# Patient Record
Sex: Male | Born: 1960 | Race: Black or African American | Hispanic: No | Marital: Single | Smoking: Former smoker
Health system: Southern US, Community
[De-identification: ages and names within clinical notes are randomized; demographics above are authoritative.]

## PROBLEM LIST (undated history)

## (undated) DIAGNOSIS — M545 Low back pain, unspecified: Secondary | ICD-10-CM

## (undated) DIAGNOSIS — H548 Legal blindness, as defined in USA: Secondary | ICD-10-CM

## (undated) DIAGNOSIS — E785 Hyperlipidemia, unspecified: Secondary | ICD-10-CM

## (undated) DIAGNOSIS — I10 Essential (primary) hypertension: Secondary | ICD-10-CM

## (undated) HISTORY — PX: EYE SURGERY: SHX253

---

## 2015-02-04 ENCOUNTER — Ambulatory Visit
Admission: RE | Admit: 2015-02-04 | Discharge: 2015-02-04 | Disposition: A | Discharge status: Home / Self Care | Payer: No Typology Code available for payment source | Source: Ambulatory Visit | Attending: Family Medicine | Admitting: Family Medicine

## 2015-02-04 ENCOUNTER — Other Ambulatory Visit: Payer: Self-pay | Admitting: Family Medicine

## 2015-02-04 DIAGNOSIS — J40 Bronchitis, not specified as acute or chronic: Secondary | ICD-10-CM

## 2015-03-11 ENCOUNTER — Encounter: Payer: Self-pay | Admitting: Internal Medicine

## 2015-03-11 ENCOUNTER — Ambulatory Visit (INDEPENDENT_AMBULATORY_CARE_PROVIDER_SITE_OTHER): Payer: Medicare HMO | Admitting: Internal Medicine

## 2015-03-11 VITALS — BP 140/80 | HR 65 | Ht 68.0 in | Wt 177.0 lb

## 2015-03-11 DIAGNOSIS — R0789 Other chest pain: Secondary | ICD-10-CM | POA: Diagnosis not present

## 2015-03-11 NOTE — Patient Instructions (Signed)
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 Frequently these patients have had multiple negative GI workups and CT scans.  Treatment consists of avoiding foods that cause gas (especially boiled eggs/ [place] food in all forms,  beans and raw vegetables like spinach and salads)  and citrucel 1 heaping tsp twice daily with a large glass of water.  Pain should improve w/in 2 weeks and if not then consider further evaluation here.

## 2015-03-11 NOTE — Progress Notes (Signed)
Subjective:     Patient ID: Benjamin Morales, male   DOB: [birth year],   MRN: [id number]  HPI  54 yobm legally blind from [place] quit smoking 10/2014 with recurrent LUQ/ cp onset age 54's multiple trips to [facility]/ [facility] with h/o brake lining exposure referred 03/11/2015  by Lin Landsman with worse cp x 2014 never eval by pulmonary previously although "they planned to send me to a [facility] pulmonary doctor next.    03/11/2015 1st [facility] office visit/ Wert ([facility] doctor previously)   Chief Complaint  Patient presents with  . Pulmonary Consult    Referred by Dr. Rosezella Florida. Pt c/o pain in left side of his chest- comes and goes and is worse with inspiration.  He states that this has been bothering him since he was 54 y/o.  He also c/o occ SOB and non prod cough.   pain is worse with deep breath mostly ant sometimes rad to post lateral  Can be relieved for sev days to weeks then come back exact same distribution, positional in that when lies flat it moves more toward the shoulder and if rolls over in bed sometimes will jolt him in a different location on the L "like a pressures/ compression sensation" No assoc h/o rash or paresthesia/ no abd complaints. Not worse with ex more bothersome day sitting that walking or noct supine.  Typically worse pain builds up over a matter of minutes then some better. Denies constipation or bloating. Likes [place] food   No obvious day to day or daytime variability or assoc chronic cough or cp  or overt sinus or hb symptoms. No unusual exp hx or h/o childhood pna/ asthma or knowledge of premature birth.  Sleeping ok without nocturnal  or early am exacerbation  of respiratory  c/o's or need for noct saba. Also denies any obvious fluctuation of symptoms with weather or environmental changes or other aggravating or alleviating factors except as outlined above   Current Medications, Allergies, Complete Past Medical History, Past Surgical  History, Family History, and Social History were reviewed in [employer] record.  ROS  The following are not active complaints unless bolded sore throat, dysphagia, dental problems, itching, sneezing,  nasal congestion or excess/ purulent secretions, ear ache,   fever, chills, sweats, unintended wt loss,  exertional cp, hemoptysis,  orthopnea pnd or leg swelling, presyncope, palpitations, abdominal pain, anorexia, nausea, vomiting, diarrhea  or change in bowel or bladder habits, change in stools or urine, dysuria,hematuria,  rash, arthralgias, visual complaints, headache, numbness, weakness or ataxia or problems with walking or coordination,  change in mood/affect or memory.           Review of Systems     Objective:   Physical Exam   amb black male nad   Wt Readings from Last 3 Encounters:  03/11/15 177 lb (80.287 kg)    Vital signs reviewed   HEENT: nl dentition, turbinates, and orophanx. Nl external ear canals without cough reflex   NECK :  without JVD/Nodes/TM/ nl carotid upstrokes bilaterally   LUNGS: no acc muscle use, clear to A and P bilaterally without cough on insp or exp maneuvers   CV:  RRR  no s3 or murmur or increase in P2, no edema   ABD:  soft and nontender with nl excursion in the supine position. No bruits or organomegaly, bowel sounds nl  MS:  warm without deformities, calf tenderness, cyanosis or clubbing  SKIN: warm and  dry without lesions    NEURO:  alert, approp, no deficits     I personally reviewed images and agree with radiology impression as follows:  CXR:   02/04/15 No active cardiopulmonary disease.  Labs from 01/30/15 reviewed  Nl tsh/ cmet/cbc  No esr       Assessment:

## 2015-03-12 ENCOUNTER — Encounter: Payer: Self-pay | Admitting: Internal Medicine

## 2015-03-12 ENCOUNTER — Ambulatory Visit (INDEPENDENT_AMBULATORY_CARE_PROVIDER_SITE_OTHER)
Admission: RE | Admit: 2015-03-12 | Discharge: 2015-03-12 | Disposition: A | Discharge status: Home / Self Care | Payer: Medicare HMO | Source: Ambulatory Visit | Attending: Internal Medicine | Admitting: Internal Medicine

## 2015-03-12 DIAGNOSIS — R0789 Other chest pain: Secondary | ICD-10-CM | POA: Diagnosis not present

## 2015-03-12 MED ORDER — IOHEXOL 350 MG/ML SOLN
80.0000 mL | Freq: Once | INTRAVENOUS | Status: AC | PRN
  Administered 2015-03-12: 80 mL via INTRAVENOUS

## 2015-03-12 NOTE — Assessment & Plan Note (Signed)
He seems most concerned about remote asbestos exposure but this would be an extremely unlikely presentation for mesothelioma and pleural plaques dont' cause this pattern pain.  Most likely this is just IBS with transmitted pain up above the dome of the L HD  I had an extended discussion with the patient reviewing all relevant studies completed to date x 22m  rec Reasonable to do CT with contrast as apparently never done(or at least not to his recollection, though this is hard to believe in [place], [place] medical [place])  IBS diet/ citrucel regularly   F/u in 2 weeks prn   Each maintenance medication was reviewed in detail including most importantly the difference between maintenance and as needed and under what circumstances the prns are to be used.  Please see instructions for details which were reviewed in writing and the patient given a copy.

## 2015-03-13 ENCOUNTER — Other Ambulatory Visit: Payer: Self-pay | Admitting: Internal Medicine

## 2015-03-13 DIAGNOSIS — R0789 Other chest pain: Secondary | ICD-10-CM

## 2015-03-13 DIAGNOSIS — N2889 Other specified disorders of kidney and ureter: Secondary | ICD-10-CM | POA: Insufficient documentation

## 2015-03-13 NOTE — Progress Notes (Signed)
Quick Note:  Order was sent to [facility] ______ 

## 2015-03-19 ENCOUNTER — Ambulatory Visit (INDEPENDENT_AMBULATORY_CARE_PROVIDER_SITE_OTHER)
Admission: RE | Admit: 2015-03-19 | Discharge: 2015-03-19 | Disposition: A | Discharge status: Home / Self Care | Payer: Medicare HMO | Source: Ambulatory Visit | Attending: Internal Medicine | Admitting: Internal Medicine

## 2015-03-19 ENCOUNTER — Other Ambulatory Visit: Payer: Self-pay | Admitting: Internal Medicine

## 2015-03-19 DIAGNOSIS — N2889 Other specified disorders of kidney and ureter: Secondary | ICD-10-CM

## 2015-03-19 MED ORDER — IOHEXOL 350 MG/ML SOLN
100.0000 mL | Freq: Once | INTRAVENOUS | Status: AC | PRN
  Administered 2015-03-19: 100 mL via INTRAVENOUS

## 2015-03-19 NOTE — Progress Notes (Signed)
Quick Note:  Spoke with pt and notified of results per Dr. Wert. Pt verbalized understanding and denied any questions.  ______ 

## 2015-03-27 ENCOUNTER — Other Ambulatory Visit (HOSPITAL_COMMUNITY): Payer: Self-pay | Admitting: Urology

## 2015-03-27 DIAGNOSIS — N2889 Other specified disorders of kidney and ureter: Secondary | ICD-10-CM

## 2015-04-10 ENCOUNTER — Other Ambulatory Visit (HOSPITAL_COMMUNITY): Payer: Self-pay | Admitting: Urology

## 2015-04-10 ENCOUNTER — Ambulatory Visit (HOSPITAL_COMMUNITY)
Admission: RE | Admit: 2015-04-10 | Discharge: 2015-04-10 | Disposition: A | Discharge status: Home / Self Care | Payer: Medicare HMO | Source: Ambulatory Visit | Attending: Urology | Admitting: Urology

## 2015-04-10 DIAGNOSIS — N2889 Other specified disorders of kidney and ureter: Secondary | ICD-10-CM

## 2015-04-10 DIAGNOSIS — Z9889 Other specified postprocedural states: Secondary | ICD-10-CM

## 2015-04-10 DIAGNOSIS — N281 Cyst of kidney, acquired: Secondary | ICD-10-CM | POA: Diagnosis not present

## 2015-04-10 DIAGNOSIS — K760 Fatty (change of) liver, not elsewhere classified: Secondary | ICD-10-CM | POA: Diagnosis not present

## 2015-04-10 DIAGNOSIS — N289 Disorder of kidney and ureter, unspecified: Secondary | ICD-10-CM | POA: Diagnosis present

## 2015-04-10 LAB — POCT I-STAT CREATININE: Creatinine, Ser: 1.1 mg/dL (ref 0.61–1.24)

## 2015-04-10 MED ORDER — GADOBENATE DIMEGLUMINE 529 MG/ML IV SOLN
15.0000 mL | Freq: Once | INTRAVENOUS | Status: AC | PRN
  Administered 2015-04-10: 15 mL via INTRAVENOUS

## 2015-05-13 ENCOUNTER — Telehealth: Payer: Self-pay | Admitting: Internal Medicine

## 2015-05-13 NOTE — Telephone Encounter (Signed)
No f/u needed except by urology as planned

## 2015-05-13 NOTE — Telephone Encounter (Signed)
Per Chantel, PCP did not need a call back. She sent message to triage to see if pt does need a follow up. No f/u mentioned from [facility] in July. Please advise MW if pt still needs to f/u thanks

## 2015-08-21 ENCOUNTER — Emergency Department (HOSPITAL_COMMUNITY): Payer: Medicare HMO

## 2015-08-21 ENCOUNTER — Encounter (HOSPITAL_COMMUNITY): Payer: Self-pay

## 2015-08-21 ENCOUNTER — Observation Stay (HOSPITAL_COMMUNITY): Payer: Medicare HMO

## 2015-08-21 ENCOUNTER — Observation Stay (HOSPITAL_COMMUNITY)
Admission: EM | Admit: 2015-08-21 | Discharge: 2015-08-22 | Disposition: A | Discharge status: Home / Self Care | Payer: Medicare HMO | Attending: Internal Medicine | Admitting: Internal Medicine

## 2015-08-21 DIAGNOSIS — G8929 Other chronic pain: Secondary | ICD-10-CM | POA: Diagnosis not present

## 2015-08-21 DIAGNOSIS — E785 Hyperlipidemia, unspecified: Secondary | ICD-10-CM | POA: Diagnosis not present

## 2015-08-21 DIAGNOSIS — R0789 Other chest pain: Secondary | ICD-10-CM | POA: Diagnosis not present

## 2015-08-21 DIAGNOSIS — G4700 Insomnia, unspecified: Secondary | ICD-10-CM | POA: Diagnosis not present

## 2015-08-21 DIAGNOSIS — R42 Dizziness and giddiness: Secondary | ICD-10-CM | POA: Diagnosis not present

## 2015-08-21 DIAGNOSIS — Z7982 Long term (current) use of aspirin: Secondary | ICD-10-CM | POA: Insufficient documentation

## 2015-08-21 DIAGNOSIS — J9811 Atelectasis: Secondary | ICD-10-CM | POA: Insufficient documentation

## 2015-08-21 DIAGNOSIS — I10 Essential (primary) hypertension: Secondary | ICD-10-CM

## 2015-08-21 DIAGNOSIS — R079 Chest pain, unspecified: Secondary | ICD-10-CM | POA: Diagnosis present

## 2015-08-21 DIAGNOSIS — Z888 Allergy status to other drugs, medicaments and biological substances status: Secondary | ICD-10-CM | POA: Diagnosis not present

## 2015-08-21 DIAGNOSIS — K210 Gastro-esophageal reflux disease with esophagitis: Secondary | ICD-10-CM | POA: Diagnosis not present

## 2015-08-21 DIAGNOSIS — H540 Blindness, both eyes: Secondary | ICD-10-CM | POA: Insufficient documentation

## 2015-08-21 DIAGNOSIS — K219 Gastro-esophageal reflux disease without esophagitis: Secondary | ICD-10-CM | POA: Diagnosis not present

## 2015-08-21 DIAGNOSIS — Z87891 Personal history of nicotine dependence: Secondary | ICD-10-CM | POA: Insufficient documentation

## 2015-08-21 DIAGNOSIS — R0602 Shortness of breath: Secondary | ICD-10-CM | POA: Diagnosis not present

## 2015-08-21 DIAGNOSIS — R05 Cough: Secondary | ICD-10-CM | POA: Insufficient documentation

## 2015-08-21 HISTORY — DX: Legal blindness, as defined in USA: H54.8

## 2015-08-21 HISTORY — DX: Essential (primary) hypertension: I10

## 2015-08-21 HISTORY — DX: Low back pain: M54.5

## 2015-08-21 HISTORY — DX: Low back pain, unspecified: M54.50

## 2015-08-21 HISTORY — DX: Hyperlipidemia, unspecified: E78.5

## 2015-08-21 LAB — BASIC METABOLIC PANEL
Anion gap: 11 (ref 5–15)
BUN: 14 mg/dL (ref 6–20)
CALCIUM: 9.4 mg/dL (ref 8.9–10.3)
CO2: 26 mmol/L (ref 22–32)
Chloride: 102 mmol/L (ref 101–111)
Creatinine, Ser: 1.3 mg/dL — ABNORMAL HIGH (ref 0.61–1.24)
GFR calc Af Amer: >60 mL/min (ref >60)
GFR, EST NON AFRICAN AMERICAN: 60 mL/min — AB (ref >60)
GLUCOSE: 145 mg/dL — AB (ref 65–99)
Potassium: 3.8 mmol/L (ref 3.5–5.1)
Sodium: 139 mmol/L (ref 135–145)

## 2015-08-21 LAB — CBC
HEMATOCRIT: 35.3 % — AB (ref 39.0–52.0)
Hemoglobin: 11.4 g/dL — ABNORMAL LOW (ref 13.0–17.0)
MCH: 27.2 pg (ref 26.0–34.0)
MCHC: 32.3 g/dL (ref 30.0–36.0)
MCV: 84.2 fL (ref 78.0–100.0)
Platelets: 221 10*3/uL (ref 150–400)
RBC: 4.19 MIL/uL — ABNORMAL LOW (ref 4.22–5.81)
RDW: 13.6 % (ref 11.5–15.5)
WBC: 7.3 10*3/uL (ref 4.0–10.5)

## 2015-08-21 LAB — TROPONIN I

## 2015-08-21 LAB — RAPID URINE DRUG SCREEN, HOSP PERFORMED
AMPHETAMINES: NOT DETECTED
BARBITURATES: NOT DETECTED
BENZODIAZEPINES: POSITIVE — AB
Cocaine: NOT DETECTED
Opiates: POSITIVE — AB
Tetrahydrocannabinol: NOT DETECTED

## 2015-08-21 LAB — I-STAT TROPONIN, ED
TROPONIN I, POC: 0 ng/mL (ref 0.00–0.08)
Troponin i, poc: 0 ng/mL (ref 0.00–0.08)

## 2015-08-21 LAB — D-DIMER, QUANTITATIVE: D-Dimer, Quant: 0.63 ug/mL-FEU — ABNORMAL HIGH (ref 0.00–0.50)

## 2015-08-21 MED ORDER — GABAPENTIN 300 MG PO CAPS
300.0000 mg | ORAL_CAPSULE | Freq: Three times a day (TID) | ORAL | Status: DC
  Administered 2015-08-22: 300 mg via ORAL
  Filled 2015-08-21 (×2): qty 1

## 2015-08-21 MED ORDER — ALPRAZOLAM 0.5 MG PO TABS
2.0000 mg | ORAL_TABLET | Freq: Every evening | ORAL | Status: DC
  Filled 2015-08-21: qty 4

## 2015-08-21 MED ORDER — AMLODIPINE BESYLATE 10 MG PO TABS
10.0000 mg | ORAL_TABLET | Freq: Every day | ORAL | Status: DC
Start: 2015-08-22 — End: 2015-08-22
  Administered 2015-08-22: 10 mg via ORAL
  Filled 2015-08-21: qty 1

## 2015-08-21 MED ORDER — IOHEXOL 350 MG/ML SOLN
100.0000 mL | Freq: Once | INTRAVENOUS | Status: AC | PRN
  Administered 2015-08-21: 100 mL via INTRAVENOUS

## 2015-08-21 MED ORDER — ASPIRIN 81 MG PO CHEW: 324.0000 mg | CHEWABLE_TABLET | Freq: Once | ORAL | Status: DC

## 2015-08-21 MED ORDER — ALPRAZOLAM 0.5 MG PO TABS: 2.0000 mg | ORAL_TABLET | Freq: Every evening | ORAL | Status: DC

## 2015-08-21 MED ORDER — PANTOPRAZOLE SODIUM 40 MG PO TBEC
40.0000 mg | DELAYED_RELEASE_TABLET | Freq: Every day | ORAL | Status: DC
  Administered 2015-08-22: 40 mg via ORAL

## 2015-08-21 MED ORDER — ACETAMINOPHEN 325 MG PO TABS
650.0000 mg | ORAL_TABLET | ORAL | Status: DC | PRN
  Administered 2015-08-22: 650 mg via ORAL
  Filled 2015-08-21: qty 2

## 2015-08-21 MED ORDER — PANTOPRAZOLE SODIUM 40 MG PO TBEC
40.0000 mg | DELAYED_RELEASE_TABLET | Freq: Every day | ORAL | Status: DC
  Filled 2015-08-21: qty 1

## 2015-08-21 MED ORDER — ATORVASTATIN CALCIUM 40 MG PO TABS
40.0000 mg | ORAL_TABLET | Freq: Every day | ORAL | Status: DC
  Administered 2015-08-21 – 2015-08-22 (×2): 40 mg via ORAL
  Filled 2015-08-21 (×2): qty 1

## 2015-08-21 MED ORDER — MORPHINE SULFATE (PF) 4 MG/ML IV SOLN
8.0000 mg | Freq: Once | INTRAVENOUS | Status: DC
  Filled 2015-08-21: qty 2

## 2015-08-21 MED ORDER — LOSARTAN POTASSIUM 50 MG PO TABS
50.0000 mg | ORAL_TABLET | Freq: Every day | ORAL | Status: DC
  Administered 2015-08-22: 50 mg via ORAL
  Filled 2015-08-21: qty 1

## 2015-08-21 MED ORDER — SODIUM CHLORIDE 0.9 % IV BOLUS (SEPSIS): 1000.0000 mL | Freq: Once | INTRAVENOUS | Status: DC

## 2015-08-21 MED ORDER — MORPHINE SULFATE (PF) 2 MG/ML IV SOLN: 1.0000 mg | INTRAVENOUS | Status: DC | PRN

## 2015-08-21 MED ORDER — OXYCODONE-ACETAMINOPHEN 5-325 MG PO TABS
1.0000 | ORAL_TABLET | Freq: Four times a day (QID) | ORAL | Status: DC | PRN
  Administered 2015-08-21 – 2015-08-22 (×2): 1 via ORAL
  Filled 2015-08-21 (×2): qty 1

## 2015-08-21 MED ORDER — OXYCODONE-ACETAMINOPHEN 10-325 MG PO TABS: 1.0000 | ORAL_TABLET | Freq: Four times a day (QID) | ORAL | Status: DC | PRN

## 2015-08-21 MED ORDER — HYDRALAZINE HCL 20 MG/ML IJ SOLN: 10.0000 mg | Freq: Four times a day (QID) | INTRAMUSCULAR | Status: DC | PRN

## 2015-08-21 MED ORDER — NITROGLYCERIN 0.4 MG SL SUBL
0.4000 mg | SUBLINGUAL_TABLET | SUBLINGUAL | Status: DC | PRN
  Administered 2015-08-21 (×2): 0.4 mg via SUBLINGUAL
  Filled 2015-08-21: qty 1

## 2015-08-21 MED ORDER — SODIUM CHLORIDE 0.9 % IV BOLUS (SEPSIS)
500.0000 mL | Freq: Once | INTRAVENOUS | Status: AC
  Administered 2015-08-21: 500 mL via INTRAVENOUS

## 2015-08-21 MED ORDER — GI COCKTAIL ~~LOC~~
30.0000 mL | Freq: Four times a day (QID) | ORAL | Status: DC | PRN
  Administered 2015-08-22: 30 mL via ORAL
  Filled 2015-08-21: qty 30

## 2015-08-21 MED ORDER — MORPHINE SULFATE (PF) 4 MG/ML IV SOLN
4.0000 mg | Freq: Once | INTRAVENOUS | Status: AC
  Administered 2015-08-21: 4 mg via INTRAVENOUS

## 2015-08-21 MED ORDER — ENOXAPARIN SODIUM 40 MG/0.4ML ~~LOC~~ SOLN
40.0000 mg | SUBCUTANEOUS | Status: DC
  Administered 2015-08-21: 40 mg via SUBCUTANEOUS
  Filled 2015-08-21: qty 0.4

## 2015-08-21 MED ORDER — NITROGLYCERIN 0.3 MG SL SUBL
0.3000 mg | SUBLINGUAL_TABLET | SUBLINGUAL | Status: DC | PRN
  Filled 2015-08-21: qty 100

## 2015-08-21 MED ORDER — OXYCODONE HCL 5 MG PO TABS
5.0000 mg | ORAL_TABLET | Freq: Four times a day (QID) | ORAL | Status: DC | PRN
Start: 2015-08-21 — End: 2015-08-22
  Administered 2015-08-21 – 2015-08-22 (×2): 5 mg via ORAL
  Filled 2015-08-21 (×2): qty 1

## 2015-08-21 MED ORDER — MORPHINE SULFATE (PF) 4 MG/ML IV SOLN
8.0000 mg | Freq: Once | INTRAVENOUS | Status: AC
  Administered 2015-08-21: 8 mg via INTRAVENOUS
  Filled 2015-08-21: qty 2

## 2015-08-21 MED ORDER — CARISOPRODOL 350 MG PO TABS
350.0000 mg | ORAL_TABLET | Freq: Three times a day (TID) | ORAL | Status: DC
Start: 2015-08-21 — End: 2015-08-22
  Administered 2015-08-21 – 2015-08-22 (×2): 350 mg via ORAL
  Filled 2015-08-21 (×2): qty 1

## 2015-08-21 MED ORDER — ONDANSETRON HCL 4 MG/2ML IJ SOLN: 4.0000 mg | Freq: Four times a day (QID) | INTRAMUSCULAR | Status: DC | PRN

## 2015-08-21 MED ORDER — ALPRAZOLAM 0.5 MG PO TABS
2.0000 mg | ORAL_TABLET | Freq: Every day | ORAL | Status: DC
  Administered 2015-08-21: 2 mg via ORAL
  Filled 2015-08-21: qty 4

## 2015-08-21 NOTE — ED Notes (Signed)
MD states pt cannot goto floor without ct.

## 2015-08-21 NOTE — ED Notes (Signed)
Pt returns from ct  

## 2015-08-21 NOTE — ED Notes (Signed)
Pt transported to CT ?

## 2015-08-21 NOTE — ED Notes (Signed)
Pt arrives EMS with c/o gradual onset chest pain. Previous episode of same with ellevated bp.. Pt states feels like heaviness

## 2015-08-21 NOTE — ED Provider Notes (Signed)
CSN: [id number]     Arrival date & time 08/21/15  1238 History   First MD Initiated Contact with Patient 08/21/15 1303     Chief Complaint  Patient presents with  . Chest Pain     (Consider location/radiation/quality/duration/timing/severity/associated sxs/prior Treatment) HPI Comments: Patient presents with chest pain. He has a history of hypertension and hyperlipidemia. He states he's had chronic chest pain several months. He states it's gotten a little worse over the last month. Today he had a sharp pain in his left chest. Radiating under his left arm. This is the same type of pain these had in the past. He has some associated shortness of breath. He describes it as a sharp heavy feeling. It's nonexertional. It's not pleuritic. It is worse with movement. He denies any leg pain or swelling. He states he had a cardiac catheterization at [facility] about 2-3 years ago. He reports that this was normal. He states that he normally takes Percocet for his chest pain as well as back pain. He denies any cough fevers or chest congestion.  Patient is a 54 y.o. male presenting with chest pain.  Chest Pain Associated symptoms: shortness of breath   Associated symptoms: no abdominal pain, no back pain, no cough, no diaphoresis, no dizziness, no fatigue, no fever, no headache, no nausea, no numbness, not vomiting and no weakness     Past Medical History  Diagnosis Date  . Hypertension   . Low back pain    Past Surgical History  Procedure Laterality Date  . Eye surgery     Family History  Problem Relation Age of Onset  . Emphysema Paternal Uncle     smoked  . Heart disease Father    Social History  Substance Use Topics  . Smoking status: Former Smoker -- 1.00 packs/day for 30 years    Types: Cigarettes    Quit date: 10/29/2014  . Smokeless tobacco: Never Used  . Alcohol Use: 0.0 oz/week    0 Standard drinks or equivalent per week     Comment: occasional    Review of  Systems  Constitutional: Negative for fever, chills, diaphoresis and fatigue.  HENT: Negative for congestion, rhinorrhea and sneezing.   Eyes: Negative.   Respiratory: Positive for shortness of breath. Negative for cough and chest tightness.   Cardiovascular: Positive for chest pain. Negative for leg swelling.  Gastrointestinal: Negative for nausea, vomiting, abdominal pain, diarrhea and blood in stool.  Genitourinary: Negative for frequency, hematuria, flank pain and difficulty urinating.  Musculoskeletal: Negative for back pain and arthralgias.  Skin: Negative for rash.  Neurological: Negative for dizziness, speech difficulty, weakness, numbness and headaches.      Allergies  Thorazine  Home Medications   Prior to Admission medications   Medication Sig Start Date End Date Taking? Authorizing Provider  alprazolam Duanne Moron) 2 MG tablet Take 2 mg by mouth every evening. 08/19/15  Yes Historical Provider, MD  amLODipine (NORVASC) 10 MG tablet Take 10 mg by mouth daily.  02/02/15  Yes Historical Provider, MD  atorvastatin (LIPITOR) 40 MG tablet Take 40 mg by mouth daily. 07/26/15  Yes Historical Provider, MD  carisoprodol (SOMA) 350 MG tablet Take 350 mg by mouth 3 (three) times daily. 08/19/15  Yes Historical Provider, MD  gabapentin (NEURONTIN) 300 MG capsule Take 300 mg by mouth 3 (three) times daily.   Yes Historical Provider, MD  losartan (COZAAR) 50 MG tablet Take 1 tablet by mouth daily. 02/02/15  Yes Historical Provider, MD  omeprazole (PRILOSEC) 20 MG capsule Take 1 capsule by mouth daily. 01/26/15  Yes Historical Provider, MD  oxyCODONE-acetaminophen (PERCOCET) 10-325 MG per tablet Take 1-2 tablets by mouth every 6 (six) hours as needed. 02/25/15  Yes Historical Provider, MD   BP 135/93 mmHg  Pulse 97  Temp(Src) 98.3 F (36.8 C)  Resp 12  Ht 5\' 8"  (1.727 m)  Wt 176 lb (79.833 kg)  BMI 26.77 kg/m2  SpO2 97% Physical Exam  Constitutional: He is oriented to person, place, and  time. He appears well-developed and well-nourished.  HENT:  Head: Normocephalic and atraumatic.  Eyes: Pupils are equal, round, and reactive to light.  Neck: Normal range of motion. Neck supple.  Cardiovascular: Normal rate, regular rhythm and normal heart sounds.   Pulmonary/Chest: Effort normal and breath sounds normal. No respiratory distress. He has no wheezes. He has no rales. He exhibits no tenderness.  Abdominal: Soft. Bowel sounds are normal. There is no tenderness. There is no rebound and no guarding.  Musculoskeletal: Normal range of motion. He exhibits no edema.  No edema or calf tenderness  Lymphadenopathy:    He has no cervical adenopathy.  Neurological: He is alert and oriented to person, place, and time.  Skin: Skin is warm and dry. No rash noted.  Psychiatric: He has a normal mood and affect.    ED Course  Procedures (including critical care time) Labs Review Results for orders placed or performed during the hospital encounter of [id number]  Basic metabolic panel  Result Value Ref Range   Sodium 139 135 - 145 mmol/L   Potassium 3.8 3.5 - 5.1 mmol/L   Chloride 102 101 - 111 mmol/L   CO2 26 22 - 32 mmol/L   Glucose, Bld 145 (H) 65 - 99 mg/dL   BUN 14 6 - 20 mg/dL   Creatinine, Ser 1.30 (H) 0.61 - 1.24 mg/dL   Calcium 9.4 8.9 - 10.3 mg/dL   GFR calc non Af Amer 60 (L) >60 mL/min   GFR calc Af Amer >60 >60 mL/min   Anion gap 11 5 - 15  CBC  Result Value Ref Range   WBC 7.3 4.0 - 10.5 K/uL   RBC 4.19 (L) 4.22 - 5.81 MIL/uL   Hemoglobin 11.4 (L) 13.0 - 17.0 g/dL   HCT 35.3 (L) 39.0 - 52.0 %   MCV 84.2 78.0 - 100.0 fL   MCH 27.2 26.0 - 34.0 pg   MCHC 32.3 30.0 - 36.0 g/dL   RDW 13.6 11.5 - 15.5 %   Platelets 221 150 - 400 K/uL  D-dimer, quantitative  Result Value Ref Range   D-Dimer, Quant 0.63 (H) 0.00 - 0.50 ug/mL-FEU  I-stat troponin, ED (not at [facility], [facility])  Result Value Ref Range   Troponin i, poc 0.00 0.00 - 0.08 ng/mL   Comment 3           Dg Chest 2  View  08/21/2015  CLINICAL DATA:  Chronic left-sided chest pain which worsened today. EXAM: CHEST  2 VIEW COMPARISON:  CT chest 03/12/2015.  PA and lateral chest 02/04/2015. FINDINGS: The lungs are clear. Heart size is upper normal. No pneumothorax or pleural effusion. No focal bony abnormality. IMPRESSION: No acute disease. Electronically Signed   By: Inge Rise M.D.   On: 08/21/2015 14:19      Imaging Review Dg Chest 2 View  08/21/2015  CLINICAL DATA:  Chronic left-sided chest pain which worsened today. EXAM: CHEST  2 VIEW COMPARISON:  CT chest  03/12/2015.  PA and lateral chest 02/04/2015. FINDINGS: The lungs are clear. Heart size is upper normal. No pneumothorax or pleural effusion. No focal bony abnormality. IMPRESSION: No acute disease. Electronically Signed   By: Inge Rise M.D.   On: 08/21/2015 14:19   I have personally reviewed and evaluated these images and lab results as part of my medical decision-making.   EKG Interpretation   Date/Time:  Thursday August 21 2015 15:06:10 EST Ventricular Rate:  89 PR Interval:  163 QRS Duration: 85 QT Interval:  351 QTC Calculation: 427 R Axis:   8 Text Interpretation:  Sinus rhythm Probable left atrial enlargement  Borderline T abnormalities, inferior leads Borderline ST elevation,  anterior leads Confirmed by Tempie Gibeault  MD, Diontay Rosencrans ([id number]) on 08/21/2015  3:13:21 PM      MDM   Final diagnoses:  Chest pain, unspecified chest pain type    Patient presents with chest pain and shortness of breath. He had some associated dizziness as well. There is no nausea or diaphoresis. He has some what appears to be J-point elevation in V2 and V3. There is no old EKG for comparison. His troponin is negative. He does have ongoing chest pain after morphine. We'll also give aspirin and nitro. I suspect this may be musculoskeletal in nature. However he has several risk factors. He has hypertension hyperlipidemia and a family history of early  heart disease. This gives him a heart score of 5. Given this I will admit him for further cardiac evaluation. His PCP is [facility] which is unassigned.  I spoke with Nevin Bloodgood with [facility] who will admit pt.  I did check a d-dimer given some ongoing tachycardia and a slight pleuritic component to the pain. This was mildly elevated. A CT chest is pending.    Malvin Johns, MD 08/21/15 [phone]

## 2015-08-21 NOTE — H&P (Signed)
[facility] History and Physical  Benjamin Morales [id number] DOB: 03/30/1961 DOA: 08/21/2015  Referring physician: Emergency Department PCP: Kristine Garbe, MD   CHIEF COMPLAINT:   Chest pain      HPI: Benjamin Morales is a 54 y.o. male presenting to the emergency room with chest pain. Patient was evaluated in July of this year by Pulmonary who felt pain was GI in nature.  Chest CTA was unrevealing. Subsequently, CT scan of the abdomen and pelvis was also unrevealing. There were some renal lesions felt to be cyst. Follow-up MRI in August revealed probable fatty liver and mildly complex renal cyst in the left upper kidney for which six-month follow-up MRI was recommended.  Chest pain almost constant for 3 weeks, it is worse with coughing, deep breaths. Exertion exacerbates pain but it occurs with rest as well. Pain radiates upward along right side of neck up right side of face. He endorses occasional cough, non-productive. He correlates the pain with elevated BP which he checks at home. Pain better when BP normal.    ED COURSE:    Labs:   Troponin 0.0. BUN 14, creatinine 1.3. Glucose 145 Normal white count. Hemoglobin 11.4,  MCV 84.         CXR:    No acute abnormalities         EKG:    Sinus rhythm Probable left atrial enlargement Borderline T abnormalities, inferior leads Borderline ST elevation, anterior leads                  Medications  aspirin chewable tablet 324 mg (324 mg Oral Not Given 08/21/15 1531)  nitroGLYCERIN (NITROSTAT) SL tablet 0.4 mg (0.4 mg Sublingual Given 08/21/15 1537)  morphine 4 MG/ML injection 8 mg (8 mg Intravenous Given 08/21/15 1340)  sodium chloride 0.9 % bolus 500 mL (500 mLs Intravenous New Bag/Given 08/21/15 1528)  morphine 4 MG/ML injection 4 mg (4 mg Intravenous Given 08/21/15 1512)   Review of Systems  Constitutional: Negative.   HENT: Negative.   Eyes: Negative.   Respiratory: Positive for shortness of breath.   Cardiovascular:  Positive for chest pain.  Genitourinary: Negative.   Musculoskeletal: Negative.   Skin: Negative.   Neurological: Negative.   Endo/Heme/Allergies: Negative.   Psychiatric/Behavioral: Negative.     Past Medical History  Diagnosis Date  . Hypertension   . Low back pain    Past Surgical History  Procedure Laterality Date  . Eye surgery      SOCIAL HISTORY:  reports that he quit smoking about 9 months ago. His smoking use included Cigarettes. He has a 30 pack-year smoking history. He has never used smokeless tobacco. He reports that he drinks alcohol. He reports that he does not use illicit drugs. Lives: at home alone   Assistive devices:   cane needed for ambulation.   Allergies  Allergen Reactions  . Thorazine [Chlorpromazine] Anaphylaxis    Family History  Problem Relation Age of Onset  . Emphysema Paternal Uncle     smoked  . Heart disease Father      Prior to Admission medications   Medication Sig Start Date End Date Taking? Authorizing Provider  alprazolam Duanne Moron) 2 MG tablet Take 2 mg by mouth every evening. 08/19/15  Yes Historical Provider, MD  amLODipine (NORVASC) 10 MG tablet Take 10 mg by mouth daily.  02/02/15  Yes Historical Provider, MD  atorvastatin (LIPITOR) 40 MG tablet Take 40 mg by mouth daily. 07/26/15  Yes Historical Provider, MD  carisoprodol (  SOMA) 350 MG tablet Take 350 mg by mouth 3 (three) times daily. 08/19/15  Yes Historical Provider, MD  gabapentin (NEURONTIN) 300 MG capsule Take 300 mg by mouth 3 (three) times daily.   Yes Historical Provider, MD  losartan (COZAAR) 50 MG tablet Take 1 tablet by mouth daily. 02/02/15  Yes Historical Provider, MD  omeprazole (PRILOSEC) 20 MG capsule Take 1 capsule by mouth daily. 01/26/15  Yes Historical Provider, MD  oxyCODONE-acetaminophen (PERCOCET) 10-325 MG per tablet Take 1-2 tablets by mouth every 6 (six) hours as needed. 02/25/15  Yes Historical Provider, MD   PHYSICAL EXAM: Filed Vitals:   08/21/15 1330  08/21/15 1422 08/21/15 1510 08/21/15 1535  BP: 120/76 135/93 138/91 113/82  Pulse: 89 97 81 88  Temp:      Resp: 12  10 16   Height:      Weight:      SpO2: 98% 97% 97% 99%    Wt Readings from Last 3 Encounters:  08/21/15 79.833 kg (176 lb)  04/10/15 74.844 kg (165 lb)  03/11/15 80.287 kg (177 lb)    General:  Pleasant blacki male. Appears calm and comfortable Eyes: PER, normal lids, irises & conjunctiva ENT: grossly normal hearing, lips & tongue Neck: no LAD, no masses Cardiovascular: RRR, no murmurs. Trace BLE edema.  Respiratory: Respirations even and unlabored. Normal respiratory effort. Lungs CTA bilaterally, no wheezes / rales .   Abdomen: soft, non-distended, non-tender, active bowel sounds. No obvious masses.  Skin: no rash seen on limited exam Musculoskeletal: grossly normal tone BUE/BLE. Mild left sided chest wall tenderness Psychiatric: grossly normal mood and affect, speech fluent and appropriate Neurologic: grossly non-focal.         LABS ON ADMISSION:    Basic Metabolic Panel:  Recent Labs Lab 08/21/15 1308  NA 139  K 3.8  CL 102  CO2 26  GLUCOSE 145*  BUN 14  CREATININE 1.30*  CALCIUM 9.4   CBC:  Recent Labs Lab 08/21/15 1308  WBC 7.3  HGB 11.4*  HCT 35.3*  MCV 84.2  PLT 221   CREATININE: 1.3 mg/dL ABNORMAL (08/21/15 1308) Estimated creatinine clearance - 61.4 mL/min  Radiological Exams on Admission: Dg Chest 2 View  08/21/2015  CLINICAL DATA:  Chronic left-sided chest pain which worsened today. EXAM: CHEST  2 VIEW COMPARISON:  CT chest 03/12/2015.  PA and lateral chest 02/04/2015. FINDINGS: The lungs are clear. Heart size is upper normal. No pneumothorax or pleural effusion. No focal bony abnormality. IMPRESSION: No acute disease. Electronically Signed   By: Inge Rise M.D.   On: 08/21/2015 14:19    ASSESSMENT / PLAN   Chest pain. Chest pain score 4. Pain nearly constant, worse with deep breaths, cough but also exacerbated by  physical activity. EKG shows borderline ST elevation in anterior leads.  First Troponion normal. CXR unrevealing. Pain may be cardiac in nature versus musculoskeletal. Doubt pulmonary source,  patient was evaluated by Pulmonary in July of this year. His d-dimer is mildly elevated. CT chest pending.  -Admit to observation-telemetry bed -Cardiology will evaluate in the a.m  -Cycle troponins -Repeat EKG in the a.m .   Insomnia.  Takes Xanax at bedtime -continue home HS Xanax  Hypertension. Takes Norvasc and Cozaar. DBP 80s to 96 in ED today.  -continue home antihypertensives -add prn hydralazine  GERD. Takes daily PPI -continue daily PPI  Blindness. Very limited vision  CONSULTANTS:   Cardiology - Spoke to Dr. Mare Ferrari. Cardiology will evaluate in am. .  Code Status: Full code DVT Prophylaxis: Lovenox Family Communication:  Patient alert, oriented and understands plan of care.  Disposition Plan: Discharge to home in 24-48 hours   Time spent: 60 minutes Tye Savoy  NP [facility] Pager [phone]

## 2015-08-21 NOTE — ED Notes (Signed)
Made CT aware that pt is ready for them to come get him.

## 2015-08-21 NOTE — ED Notes (Signed)
Hospitalist came into room, and delayed CT.

## 2015-08-21 NOTE — ED Notes (Signed)
Pt states pain free after 2 nitro sl.

## 2015-08-22 ENCOUNTER — Other Ambulatory Visit: Payer: Self-pay | Admitting: Student

## 2015-08-22 ENCOUNTER — Observation Stay (HOSPITAL_BASED_OUTPATIENT_CLINIC_OR_DEPARTMENT_OTHER): Payer: Medicare HMO

## 2015-08-22 ENCOUNTER — Encounter (HOSPITAL_COMMUNITY): Payer: Self-pay | Admitting: Student

## 2015-08-22 DIAGNOSIS — G4700 Insomnia, unspecified: Secondary | ICD-10-CM | POA: Diagnosis not present

## 2015-08-22 DIAGNOSIS — E785 Hyperlipidemia, unspecified: Secondary | ICD-10-CM

## 2015-08-22 DIAGNOSIS — I10 Essential (primary) hypertension: Secondary | ICD-10-CM

## 2015-08-22 DIAGNOSIS — K219 Gastro-esophageal reflux disease without esophagitis: Secondary | ICD-10-CM | POA: Diagnosis not present

## 2015-08-22 DIAGNOSIS — R071 Chest pain on breathing: Secondary | ICD-10-CM

## 2015-08-22 DIAGNOSIS — R079 Chest pain, unspecified: Secondary | ICD-10-CM

## 2015-08-22 DIAGNOSIS — R0789 Other chest pain: Secondary | ICD-10-CM | POA: Diagnosis not present

## 2015-08-22 LAB — TROPONIN I

## 2015-08-22 MED ORDER — ASPIRIN EC 81 MG PO TBEC: 81.0000 mg | DELAYED_RELEASE_TABLET | Freq: Every day | ORAL | Status: AC

## 2015-08-22 NOTE — Progress Notes (Signed)
D/C instructions discussed with pt; all questions answered and pt verbalized understanding.

## 2015-08-22 NOTE — Progress Notes (Signed)
[facility] PROGRESS NOTE  Benjamin Morales [id number] DOB: 06/25/1961 DOA: 08/21/2015 PCP: Kristine Garbe, MD  Assessment/Plan: 1. Chest pain. -Benjamin Morales presenting with chest pain having atypical features. He ascribes sharp stabbing pain located in the left thoracic wall worsened with deep inspiration. -Workup thus far has been unrevealing, EKG did not reveal acute ischemic changes. Thus far he has had 2 negative troponins. CT scan of lungs with IV contrast did not show evidence for pulmonary embolism. CT scan did not reveal pulmonary edema or pneumonia as well.  -Suspect symptoms may be related to pleurisy. -Cardiology consultation placed  2.   Hypertension. -Continue amlodipine 10 mg by mouth daily and losartan 50 mg by mouth daily  Code Status: Full code Family Communication: Family not present Disposition Plan: Patient in overnight observation, anticipate discharge within the next 24 hours.   Consultants: Cardiology   HPI/Subjective: Benjamin Morales is a pleasant 54 year old gentleman with a past medical history of hypertension who was admitted to the medicine service on 08/21/2015 when he presented with chest pain having atypical features. EKG did not reveal acute ischemic changes. He was monitored overnight on continuous cardiac monitoring with cycling of troponins that remained negative. He described his chest pain is sharp stabbing located in the left thoracic wall worsened by deep inspiration. Symptoms appearing consistent with pleurisy. Chest x-ray did not show acute disease.  Objective: Filed Vitals:   08/22/15 0535 08/22/15 0838  BP: 124/84 139/84  Pulse: 64 61  Temp: 98.6 F (37 C) 98.4 F (36.9 C)  Resp: 20 18    Intake/Output Summary (Last 24 hours) at 08/22/15 1017 Last data filed at 08/22/15 0945  Gross per 24 hour  Intake   1240 ml  Output   2675 ml  Net  -1435 ml   Filed Weights   08/21/15 1253 08/21/15 1828 08/22/15 0535  Weight: 79.833 kg (176  lb) 83.144 kg (183 lb 4.8 oz) 81.33 kg (179 lb 4.8 oz)    Exam:   General:  No acute distress, he is awake alert oriented. Asking for breakfast  Cardiovascular: Regular rate and rhythm normal S1-S2 no murmurs rubs or gallops  Respiratory: Normal respiratory effort, lungs are clear to auscultation bilaterally  Abdomen: Soft nontender nontender positive bowel sounds  Musculoskeletal he reported pain to palpation along the left chest wall, no hematoma or evidence of trauma over chest wall.  Data Reviewed: Basic Metabolic Panel:  Recent Labs Lab 08/21/15 1308  NA 139  K 3.8  CL 102  CO2 26  GLUCOSE 145*  BUN 14  CREATININE 1.30*  CALCIUM 9.4   Liver Function Tests: No results for input(s): AST, ALT, ALKPHOS, BILITOT, PROT, ALBUMIN in the last 168 hours. No results for input(s): LIPASE, AMYLASE in the last 168 hours. No results for input(s): AMMONIA in the last 168 hours. CBC:  Recent Labs Lab 08/21/15 1308  WBC 7.3  HGB 11.4*  HCT 35.3*  MCV 84.2  PLT 221   Cardiac Enzymes:  Recent Labs Lab 08/21/15 1859 08/21/15 2116 08/22/15 0024  TROPONINI <0.03 <0.03 <0.03   BNP (last 3 results) No results for input(s): BNP in the last 8760 hours.  ProBNP (last 3 results) No results for input(s): PROBNP in the last 8760 hours.  CBG: No results for input(s): GLUCAP in the last 168 hours.  No results found for this or any previous visit (from the past 240 hour(s)).   Studies: Dg Chest 2 View  08/21/2015  CLINICAL DATA:  Chronic left-sided  chest pain which worsened today. EXAM: CHEST  2 VIEW COMPARISON:  CT chest 03/12/2015.  PA and lateral chest 02/04/2015. FINDINGS: The lungs are clear. Heart size is upper normal. No pneumothorax or pleural effusion. No focal bony abnormality. IMPRESSION: No acute disease. Electronically Signed   By: Inge Rise M.D.   On: 08/21/2015 14:19   Ct Angio Chest Pe W/cm &/or Wo Cm  08/21/2015  CLINICAL DATA:  Left-sided chest  pain for 2-3 weeks. Dizziness and shortness of breath. EXAM: CT ANGIOGRAPHY CHEST WITH CONTRAST TECHNIQUE: Multidetector CT imaging of the chest was performed using the standard protocol during bolus administration of intravenous contrast. Multiplanar CT image reconstructions and MIPs were obtained to evaluate the vascular anatomy. CONTRAST:  160mL OMNIPAQUE IOHEXOL 350 MG/ML SOLN COMPARISON:  Chest radiograph, 08/21/2015. FINDINGS: Angiographic study: No evidence of a pulmonary embolus. Pulmonary arteries are normal in caliber. Thoracic aorta is normal in caliber. No dissection. No significant atherosclerosis. Neck base and axilla:  No mass or adenopathy.  Normal thyroid. Mediastinum and hila: Heart mildly enlarged. No significant coronary artery calcifications. No mediastinal or hilar masses or pathologically enlarged lymph nodes. Lungs and pleura: There is dependent reticular and linear opacity most evident in the lower lobes consistent with atelectasis. No convincing pneumonia. No pulmonary edema. 5 mm nodule lies in the right upper lobe peripherally, image 60, series 407, similar to the prior chest CT. 3 mm nodule is seen in the posterior, inferior and peripheral left upper lobe lingula, not visualized on the prior CT. Other tiny nodules are similar to the prior CT. No pleural effusion. No pneumothorax. Limited upper abdomen: Small probable cyst from the superior margin of the right kidney. 8 mm low-density lesion in the right lobe of the liver. There are several other small low-density liver lesions, all likely cysts. These were present on the prior CT. Musculoskeletal:  Unremarkable. Review of the MIP images confirms the above findings. IMPRESSION: 1. No evidence of pulmonary embolism. 2. No acute findings. 3. There is dependent atelectasis mostly in the lower lobes. No pulmonary edema or convincing pneumonia. 4. Small pulmonary nodules similar to the prior CT. Recommend CT follow-up in 6 months per  Fleischner criteria as described on the prior CT. Electronically Signed   By: Lajean Manes M.D.   On: 08/21/2015 18:10    Scheduled Meds: . ALPRAZolam  2 mg Oral QHS  . amLODipine  10 mg Oral Daily  . aspirin  324 mg Oral Once  . atorvastatin  40 mg Oral Daily  . carisoprodol  350 mg Oral TID  . enoxaparin (LOVENOX) injection  40 mg Subcutaneous Q24H  . gabapentin  300 mg Oral TID  . losartan  50 mg Oral Daily  . pantoprazole  40 mg Oral Daily  . pantoprazole  40 mg Oral Q0600   Continuous Infusions:   Active Problems:   Other chest pain   Chest pain   Insomnia   Hypertension   GERD (gastroesophageal reflux disease)    Time spent:     Kelvin Cellar  [facility] Pager [phone]. If 7PM-7AM, please contact night-coverage at [email or web address], password [facility] 08/22/2015, 10:17 AM

## 2015-08-22 NOTE — Discharge Summary (Signed)
Physician Discharge Summary  Benjamin Morales [id number] DOB: Apr 10, 1961 DOA: 08/21/2015  PCP: Kristine Garbe, MD  Admit date: 08/21/2015 Discharge date: 08/22/2015  Time spent: 35 minutes  Recommendations for Outpatient Follow-up:  1. Patient admitted for chest pain rule out, will be set up with outpatient stress test   Discharge Diagnoses:  Active Problems:   Other chest pain   Chest pain   Insomnia   Hypertension   GERD (gastroesophageal reflux disease)   Discharge Condition: Stable  Diet recommendation: Heart healthy  Filed Weights   08/21/15 1253 08/21/15 1828 08/22/15 0535  Weight: 79.833 kg (176 lb) 83.144 kg (183 lb 4.8 oz) 81.33 kg (179 lb 4.8 oz)    History of present illness:  Benjamin Morales is a 54 y.o. male presenting to the emergency room with chest pain. Patient was evaluated in July of this year by Pulmonary who felt pain was GI in nature. Chest CTA was unrevealing. Subsequently, CT scan of the abdomen and pelvis was also unrevealing. There were some renal lesions felt to be cyst. Follow-up MRI in August revealed probable fatty liver and mildly complex renal cyst in the left upper kidney for which six-month follow-up MRI was recommended.  Chest pain almost constant for 3 weeks, it is worse with coughing, deep breaths. Exertion exacerbates pain but it occurs with rest as well. Pain radiates upward along right side of neck up right side of face. He endorses occasional cough, non-productive. He correlates the pain with elevated BP which he checks at home. Pain better when BP normal.   Hospital Course:  Mr. Benjamin Morales is a pleasant 54 year old gentleman with a past medical history of hypertension who was admitted to the medicine service on 08/21/2015 when he presented with chest pain having atypical features. EKG did not reveal acute ischemic changes. He was monitored overnight on continuous cardiac monitoring with cycling of troponins that remained negative. He described  his chest pain is sharp stabbing located in the left thoracic wall worsened by deep inspiration. Symptoms appearing consistent with pleurisy. Chest x-ray did not show acute disease. Transthoracic echocardiogram did not reveal wall motion abnormalities. Cardiology did not recommend further cardiac workup in the inpatient setting. He'll be set up with an outpatient stress test. He was discharged home in stable condition on 08/22/2015  Procedures:  Transthoracic echocardiogram performed on 08/22/2015 impression: - Left ventricle: The cavity size was normal. Systolic function was normal. The estimated ejection fraction was in the range of 55% to 60%. Wall motion was normal; there were no regional wall motion abnormalities. Left ventricular diastolic function parameters were normal.  Consultations:  Cardiology  Discharge Exam: Filed Vitals:   08/22/15 0535 08/22/15 0838  BP: 124/84 139/84  Pulse: 64 61  Temp: 98.6 F (37 C) 98.4 F (36.9 C)  Resp: 20 18      Discharge Instructions  General: No acute distress, he is awake alert oriented. Asking for breakfast  Cardiovascular: Regular rate and rhythm normal S1-S2 no murmurs rubs or gallops  Respiratory: Normal respiratory effort, lungs are clear to auscultation bilaterally  Abdomen: Soft nontender nontender positive bowel sounds  Musculoskeletal he reported pain to palpation along the left chest wall, no hematoma or evidence of trauma over chest wall.  Discharge Instructions    Call MD for:  difficulty breathing, headache or visual disturbances    Complete by:  As directed      Call MD for:  extreme fatigue    Complete by:  As directed  Call MD for:  hives    Complete by:  As directed      Call MD for:  persistant dizziness or light-headedness    Complete by:  As directed      Call MD for:  persistant nausea and vomiting    Complete by:  As directed      Call MD for:  redness, tenderness, or signs of infection  (pain, swelling, redness, odor or green/yellow discharge around incision site)    Complete by:  As directed      Call MD for:  severe uncontrolled pain    Complete by:  As directed      Call MD for:  temperature >100.4    Complete by:  As directed      Call MD for:    Complete by:  As directed      Diet - low sodium heart healthy    Complete by:  As directed      Increase activity slowly    Complete by:  As directed           Current Discharge Medication List    START taking these medications   Details  aspirin EC 81 MG tablet Take 1 tablet (81 mg total) by mouth daily. Qty: 30 tablet, Refills: 0      CONTINUE these medications which have NOT CHANGED   Details  alprazolam (XANAX) 2 MG tablet Take 2 mg by mouth every evening. Refills: 0    amLODipine (NORVASC) 10 MG tablet Take 10 mg by mouth daily.     atorvastatin (LIPITOR) 40 MG tablet Take 40 mg by mouth daily. Refills: 1    carisoprodol (SOMA) 350 MG tablet Take 350 mg by mouth 3 (three) times daily. Refills: 0    gabapentin (NEURONTIN) 300 MG capsule Take 300 mg by mouth 3 (three) times daily.    losartan (COZAAR) 50 MG tablet Take 1 tablet by mouth daily.    omeprazole (PRILOSEC) 20 MG capsule Take 1 capsule by mouth daily.    oxyCODONE-acetaminophen (PERCOCET) 10-325 MG per tablet Take 1-2 tablets by mouth every 6 (six) hours as needed. Refills: 0       Allergies  Allergen Reactions  . Thorazine [Chlorpromazine] Anaphylaxis   Follow-up Information    Follow up with Peter Martinique, MD.   Specialty:  Cardiology   Why:  The office will call you within the next week to arrange an exercise stress test. If you do not hear from them, please call the number above.   Contact information:   [address] [place] [address] [phone]       Follow up with REESE,BETTI D, MD In 2 weeks.   Specialty:  Family Medicine   Contact information:   [id number]  [address] [phone]        The results of significant diagnostics from this hospitalization (including imaging, microbiology, ancillary and laboratory) are listed below for reference.    Significant Diagnostic Studies: Dg Chest 2 View  08/21/2015  CLINICAL DATA:  Chronic left-sided chest pain which worsened today. EXAM: CHEST  2 VIEW COMPARISON:  CT chest 03/12/2015.  PA and lateral chest 02/04/2015. FINDINGS: The lungs are clear. Heart size is upper normal. No pneumothorax or pleural effusion. No focal bony abnormality. IMPRESSION: No acute disease. Electronically Signed   By: Inge Rise M.D.   On: 08/21/2015 14:19   Ct Angio Chest Pe W/cm &/or Wo Cm  08/21/2015  CLINICAL DATA:  Left-sided chest pain for 2-3 weeks. Dizziness and shortness of breath. EXAM: CT ANGIOGRAPHY CHEST WITH CONTRAST TECHNIQUE: Multidetector CT imaging of the chest was performed using the standard protocol during bolus administration of intravenous contrast. Multiplanar CT image reconstructions and MIPs were obtained to evaluate the vascular anatomy. CONTRAST:  166mL OMNIPAQUE IOHEXOL 350 MG/ML SOLN COMPARISON:  Chest radiograph, 08/21/2015. FINDINGS: Angiographic study: No evidence of a pulmonary embolus. Pulmonary arteries are normal in caliber. Thoracic aorta is normal in caliber. No dissection. No significant atherosclerosis. Neck base and axilla:  No mass or adenopathy.  Normal thyroid. Mediastinum and hila: Heart mildly enlarged. No significant coronary artery calcifications. No mediastinal or hilar masses or pathologically enlarged lymph nodes. Lungs and pleura: There is dependent reticular and linear opacity most evident in the lower lobes consistent with atelectasis. No convincing pneumonia. No pulmonary edema. 5 mm nodule lies in the right upper lobe peripherally, image 60, series 407, similar to the prior chest CT. 3 mm nodule is seen in the posterior, inferior and peripheral left upper lobe lingula, not  visualized on the prior CT. Other tiny nodules are similar to the prior CT. No pleural effusion. No pneumothorax. Limited upper abdomen: Small probable cyst from the superior margin of the right kidney. 8 mm low-density lesion in the right lobe of the liver. There are several other small low-density liver lesions, all likely cysts. These were present on the prior CT. Musculoskeletal:  Unremarkable. Review of the MIP images confirms the above findings. IMPRESSION: 1. No evidence of pulmonary embolism. 2. No acute findings. 3. There is dependent atelectasis mostly in the lower lobes. No pulmonary edema or convincing pneumonia. 4. Small pulmonary nodules similar to the prior CT. Recommend CT follow-up in 6 months per Fleischner criteria as described on the prior CT. Electronically Signed   By: Lajean Manes M.D.   On: 08/21/2015 18:10    Microbiology: No results found for this or any previous visit (from the past 240 hour(s)).   Labs: Basic Metabolic Panel:  Recent Labs Lab 08/21/15 1308  NA 139  K 3.8  CL 102  CO2 26  GLUCOSE 145*  BUN 14  CREATININE 1.30*  CALCIUM 9.4   Liver Function Tests: No results for input(s): AST, ALT, ALKPHOS, BILITOT, PROT, ALBUMIN in the last 168 hours. No results for input(s): LIPASE, AMYLASE in the last 168 hours. No results for input(s): AMMONIA in the last 168 hours. CBC:  Recent Labs Lab 08/21/15 1308  WBC 7.3  HGB 11.4*  HCT 35.3*  MCV 84.2  PLT 221   Cardiac Enzymes:  Recent Labs Lab 08/21/15 1859 08/21/15 2116 08/22/15 0024  TROPONINI <0.03 <0.03 <0.03   BNP: BNP (last 3 results) No results for input(s): BNP in the last 8760 hours.  ProBNP (last 3 results) No results for input(s): PROBNP in the last 8760 hours.  CBG: No results for input(s): GLUCAP in the last 168 hours.     Signed:  Kelvin Cellar MD  FACP  [facility] 08/22/2015, 1:52 PM

## 2015-08-22 NOTE — Progress Notes (Signed)
  Echocardiogram 2D Echocardiogram has been performed.  Benjamin Morales 08/22/2015, 12:19 PM

## 2015-08-22 NOTE — Consult Note (Signed)
CARDIOLOGY CONSULT NOTE   Patient ID: Benjamin Morales MRN: [id number], DOB/AGE: 56-Oct-1962   Admit date: 08/21/2015 Date of Consult: 08/22/2015 Reason for Consult: Chest Pain   Primary Physician: Kristine Garbe, MD Primary Cardiologist: New  HPI: Benjamin Morales is a 54 y.o. male with past medical history of HTN, HLD, and Legally Blind who presented to [facility] ED on 08/21/2015 for chest pain.  Patient reports he has been having pleuritic pain for the past several months. This is followed by Dr. Melvyn Novas and pulmonary nodules have been noted on his CT, but no etiology of his pain was identified on the studies. The patient reports he was ultimately diagnosed with pleurisy.   Yesterday morning, while standing up at work, he developed a pressure in his left chest. He reports associated diaphoresis and radiating pain down his left arm. He says the pain felt completely different from his previous pain, as the pressure was not influenced by his breathing. The pain lasted a total of 5-6 hours and was relieved with administration of 2 SL NTG while in the ED. Denies ever having this type of pain in the past.  The patient denies any repeat episodes of chest pain or pressure while admitted. Reports feeling well this morning. Anxious to leave the hospital as he has a train ticket to [place] to see his family later today.  Reports his blood pressure has been elevated over the past few months and he has been started on Losartan. While admitted, his BP has been controlled at 110/54 - 143/99. Cyclic troponin values have been negative. His d-dimer was elevated to 0.63 on admission. Therefore, a CT Scan was obtained which showed no evidence of a  Reports having an exercise stress test two years ago which he says was normal. Has never had a catheterization. Reports his dad had heart disease and recurrent strokes.     Problem List Past Medical History  Diagnosis Date  . Hypertension   . Low back pain   . HLD  (hyperlipidemia)   . Legally blind     Past Surgical History  Procedure Laterality Date  . Eye surgery       Allergies Allergies  Allergen Reactions  . Thorazine [Chlorpromazine] Anaphylaxis      Inpatient Medications  . ALPRAZolam  2 mg Oral QHS  . amLODipine  10 mg Oral Daily  . aspirin  324 mg Oral Once  . atorvastatin  40 mg Oral Daily  . carisoprodol  350 mg Oral TID  . enoxaparin (LOVENOX) injection  40 mg Subcutaneous Q24H  . gabapentin  300 mg Oral TID  . losartan  50 mg Oral Daily  . pantoprazole  40 mg Oral Daily  . pantoprazole  40 mg Oral Q0600    Family History Family History  Problem Relation Age of Onset  . Emphysema Paternal Uncle     smoked  . Heart disease Father   . Stroke Father      Social History Social History   Social History  . Marital Status: Single    Spouse Name: N/A  . Number of Children: N/A  . Years of Education: N/A   Occupational History  . Not on file.   Social History Main Topics  . Smoking status: Former Smoker -- 1.00 packs/day for 30 years    Types: Cigarettes    Quit date: 08/30/2013  . Smokeless tobacco: Never Used  . Alcohol Use: 0.0 oz/week    0 Standard drinks or equivalent per  week     Comment: Occasional  . Drug Use: No  . Sexual Activity: Not on file   Other Topics Concern  . Not on file   Social History Narrative     Review of Systems General:  No chills, fever, night sweats or weight changes.  Cardiovascular:  No dyspnea on exertion, edema, orthopnea, palpitations, paroxysmal nocturnal dyspnea. Positive for chest pain and diaphoresis. Dermatological: No rash, lesions/masses Respiratory: No cough, dyspnea Urologic: No hematuria, dysuria Abdominal:   No nausea, vomiting, diarrhea, bright red blood per rectum, melena, or hematemesis Neurologic:  No visual changes, wkns, changes in mental status. All other systems reviewed and are otherwise negative except as noted above.  Physical Exam Blood  pressure 139/84, pulse 61, temperature 98.4 F (36.9 C), temperature source Oral, resp. rate 18, height 5\' 8"  (1.727 m), weight 179 lb 4.8 oz (81.33 kg), SpO2 98 %.  General: Pleasant, African American male in NAD Psych: Normal affect. Neuro: Alert and oriented X 3. Moves all extremities spontaneously. HEENT: Normal  Neck: Supple without bruits or JVD. Lungs:  Resp regular and unlabored, CTA without wheezing or rales. Heart: RRR no s3, s4, or murmurs. Abdomen: Soft, non-tender, non-distended, BS + x 4.  Extremities: No clubbing, cyanosis or edema. DP/PT/Radials 2+ and equal bilaterally.  Labs   Recent Labs  08/21/15 1859 08/21/15 2116 08/22/15 0024  TROPONINI <0.03 <0.03 <0.03   Lab Results  Component Value Date   WBC 7.3 08/21/2015   HGB 11.4* 08/21/2015   HCT 35.3* 08/21/2015   MCV 84.2 08/21/2015   PLT 221 08/21/2015     Recent Labs Lab 08/21/15 1308  NA 139  K 3.8  CL 102  CO2 26  BUN 14  CREATININE 1.30*  CALCIUM 9.4  GLUCOSE 145*   No results found for: CHOL, HDL, LDLCALC, TRIG Lab Results  Component Value Date   DDIMER 0.63* 08/21/2015    Radiology/Studies  Dg Chest 2 View: 08/21/2015  CLINICAL DATA:  Chronic left-sided chest pain which worsened today. EXAM: CHEST  2 VIEW COMPARISON:  CT chest 03/12/2015.  PA and lateral chest 02/04/2015. FINDINGS: The lungs are clear. Heart size is upper normal. No pneumothorax or pleural effusion. No focal bony abnormality. IMPRESSION: No acute disease. Electronically Signed   By: Inge Rise M.D.   On: 08/21/2015 14:19   Ct Angio Chest Pe W/cm &/or Wo Cm: 08/21/2015  CLINICAL DATA:  Left-sided chest pain for 2-3 weeks. Dizziness and shortness of breath. EXAM: CT ANGIOGRAPHY CHEST WITH CONTRAST TECHNIQUE: Multidetector CT imaging of the chest was performed using the standard protocol during bolus administration of intravenous contrast. Multiplanar CT image reconstructions and MIPs were obtained to evaluate the  vascular anatomy. CONTRAST:  131mL OMNIPAQUE IOHEXOL 350 MG/ML SOLN COMPARISON:  Chest radiograph, 08/21/2015. FINDINGS: Angiographic study: No evidence of a pulmonary embolus. Pulmonary arteries are normal in caliber. Thoracic aorta is normal in caliber. No dissection. No significant atherosclerosis. Neck base and axilla:  No mass or adenopathy.  Normal thyroid. Mediastinum and hila: Heart mildly enlarged. No significant coronary artery calcifications. No mediastinal or hilar masses or pathologically enlarged lymph nodes. Lungs and pleura: There is dependent reticular and linear opacity most evident in the lower lobes consistent with atelectasis. No convincing pneumonia. No pulmonary edema. 5 mm nodule lies in the right upper lobe peripherally, image 60, series 407, similar to the prior chest CT. 3 mm nodule is seen in the posterior, inferior and peripheral left upper lobe lingula, not visualized  on the prior CT. Other tiny nodules are similar to the prior CT. No pleural effusion. No pneumothorax. Limited upper abdomen: Small probable cyst from the superior margin of the right kidney. 8 mm low-density lesion in the right lobe of the liver. There are several other small low-density liver lesions, all likely cysts. These were present on the prior CT. Musculoskeletal:  Unremarkable. Review of the MIP images confirms the above findings. IMPRESSION: 1. No evidence of pulmonary embolism. 2. No acute findings. 3. There is dependent atelectasis mostly in the lower lobes. No pulmonary edema or convincing pneumonia. 4. Small pulmonary nodules similar to the prior CT. Recommend CT follow-up in 6 months per Fleischner criteria as described on the prior CT. Electronically Signed   By: Lajean Manes M.D.   On: 08/21/2015 18:10    ECG: NSR, Rate in 80's.  No previous tracings available for comparison.   ECHOCARDIOGRAM: None   ASSESSMENT AND PLAN  1. Chest Pain with Mixed Typical and Atypical Features - presented after  episode of chest pressure at work, associated with diaphoresis and relieved with SL NTG. However, the pain was constant for 5+ hours. He does have risk factors of HTN and HLD. - cyclic troponin values have been negative. - will obtain echocardiogram. If normal, would be stable for discharge and we can arrange outpatient NST.  If EF significantly low or wall motion abnormalities are noted, would need further inpatient workup with a catheterization.  2. HTN - well-controlled - continue Losartan 50mg  daily and Amlodipine 10mg  daily.  3. HLD - continue statin   Signed, Erma Heritage, PA-C 08/22/2015, 10:57 AM Pager: [phone] Patient seen and examined and history reviewed. Agree with above findings and plan. 54 yo BM presents with several hours of fairly constant chest pain. Worse with deep breath and position. Reports normal stress test 2 years ago. Ecg and troponins are negative. Symptoms are atypical. Echo is in progress. If Echo is normal I think he can be safely DC today. Reports recent increase in BP but here BP is normal. Patient is planning to travel to [place] over the holiday weekend. We could arrange an outpatient ETT later.  Mayela Bullard [place], [place] 08/22/2015 12:34 PM

## 2015-08-26 ENCOUNTER — Telehealth (HOSPITAL_COMMUNITY): Payer: Self-pay | Admitting: *Deleted

## 2015-08-26 NOTE — Telephone Encounter (Signed)
ERROR

## 2016-03-19 ENCOUNTER — Ambulatory Visit
Admission: RE | Admit: 2016-03-19 | Discharge: 2016-03-19 | Disposition: A | Discharge status: Home / Self Care | Payer: Medicare HMO | Source: Ambulatory Visit | Attending: Family Medicine | Admitting: Family Medicine

## 2016-03-19 ENCOUNTER — Other Ambulatory Visit: Payer: Self-pay | Admitting: Family Medicine

## 2016-03-19 DIAGNOSIS — M79673 Pain in unspecified foot: Secondary | ICD-10-CM

## 2016-05-26 ENCOUNTER — Other Ambulatory Visit: Payer: Self-pay | Admitting: Urology

## 2016-05-26 DIAGNOSIS — N281 Cyst of kidney, acquired: Secondary | ICD-10-CM

## 2016-06-18 ENCOUNTER — Ambulatory Visit (HOSPITAL_COMMUNITY): Admission: RE | Admit: 2016-06-18 | Payer: Medicare HMO | Source: Ambulatory Visit

## 2016-06-20 ENCOUNTER — Ambulatory Visit (HOSPITAL_COMMUNITY)
Admission: RE | Admit: 2016-06-20 | Discharge: 2016-06-20 | Disposition: A | Discharge status: Home / Self Care | Payer: Medicare HMO | Source: Ambulatory Visit | Attending: Urology | Admitting: Urology

## 2016-06-20 DIAGNOSIS — K760 Fatty (change of) liver, not elsewhere classified: Secondary | ICD-10-CM | POA: Diagnosis not present

## 2016-06-20 DIAGNOSIS — N281 Cyst of kidney, acquired: Secondary | ICD-10-CM | POA: Diagnosis not present

## 2016-06-20 LAB — POCT I-STAT CREATININE: Creatinine, Ser: 1.4 mg/dL — ABNORMAL HIGH (ref 0.61–1.24)

## 2016-06-20 MED ORDER — GADOBENATE DIMEGLUMINE 529 MG/ML IV SOLN
17.0000 mL | Freq: Once | INTRAVENOUS | Status: AC | PRN
  Administered 2016-06-20: 17 mL via INTRAVENOUS

## 2016-06-25 ENCOUNTER — Encounter (INDEPENDENT_AMBULATORY_CARE_PROVIDER_SITE_OTHER): Payer: Self-pay | Admitting: Neurology

## 2016-06-25 ENCOUNTER — Ambulatory Visit (INDEPENDENT_AMBULATORY_CARE_PROVIDER_SITE_OTHER): Payer: Medicare HMO | Admitting: Neurology

## 2016-06-25 DIAGNOSIS — R269 Unspecified abnormalities of gait and mobility: Secondary | ICD-10-CM

## 2016-06-25 DIAGNOSIS — R202 Paresthesia of skin: Secondary | ICD-10-CM | POA: Diagnosis not present

## 2016-06-25 DIAGNOSIS — Z0289 Encounter for other administrative examinations: Secondary | ICD-10-CM

## 2016-06-25 NOTE — Procedures (Signed)
Full Name: Benjamin Morales Gender: Male MRN #: [id number] Date of Birth: 12-28-1960    Visit Date: 06/25/2016 10:09 Age: 55 Years 0 Months Old Examining Physician: Marcial Pacas, MD  Referring physician: Francee Piccolo, MD History: 55 year old male, presenting with intermittent bilateral feet paresthesia  Summary of test:  Nerve conduction study: Bilateral sural, peroneal sensory responses were normal.  Bilateral peroneal to EDB and tibial motor responses were normal.  Electromyography: Selective needle examinations of bilateral lower extremity muscles and lumbar sacral paraspinals were normal.  Conclusion: This is a normal study, there is no electrodiagnostic evidence of large fiber peripheral neuropathy of bilateral lumbar sacral radiculopathy.    ------------------------------- Marcial Pacas, M.D.  [facility] [place], [place] [address] Tel: [phone] Fax: [phone]        [facility]    Nerve / Sites Rec. Site Peak Lat Ref.  Amp Ref. Segments Distance    ms ms V V  cm  L Sural - Ankle (Calf)     Calf Ankle 4.3 ?4.4 10 ?6 Calf - Ankle 14  L Superficial peroneal - Ankle     Lat leg Ankle 3.9 ?4.4 12 ?6 Lat leg - Ankle 14  R Sural - Ankle (Calf)     Calf Ankle 4.1 ?4.4 25 ?6 Calf - Ankle 14  R Superficial peroneal - Ankle     Lat leg Ankle 4.3 ?4.4 8 ?6 Lat leg - Ankle 14             [facility]    Nerve / Sites Muscle Latency Ref. Amplitude Ref. Rel Amp Segments Distance Lat Diff Velocity Ref. Area    ms ms mV mV %  cm ms m/s m/s mVms  L Peroneal - EDB     Ankle EDB 4.2 ?6.5 3.1 ?2.0 100 Ankle - EDB 9    12.7     Fib head EDB 12.3  2.5  78.6 Fib head - Ankle 33 8.1 41 ?44 17.3     Pop fossa EDB 14.6  2.3  94.6 Pop fossa - Fib head 10 2.3 43 ?44 13.7         Pop fossa - Ankle  10.4     L Tibial - AH     Ankle AH 4.5 ?5.8 11.8 ?4.0 100 Ankle - AH 9    33.1     Pop fossa AH 14.2  8.0  67.2 Pop fossa - Ankle 39 9.7 40 ?41 28.3  R Peroneal  - EDB     Ankle EDB 5.3 ?6.5 4.3 ?2.0 100 Ankle - EDB 9    14.9     Fib head EDB 13.3  3.7  85 Fib head - Ankle 34 8.0 43 ?44      Pop fossa EDB 16.3  3.3  89.7 Pop fossa - Fib head 12 3.0 40 ?44 14.9         Pop fossa - Ankle  11.0     R Tibial - AH     Ankle AH 4.0 ?5.8 11.7 ?4.0 100 Ankle - AH 9    35.5     Pop fossa AH 13.2  6.7  57.2 Pop fossa - Ankle 40 9.2 43 ?41 28.3             F  Wave    Nerve F Lat Ref.   ms ms  L Peroneal - EDB 56.6 ?56.0  L Tibial - AH 55.7 ?56.0  R Peroneal - EDB 55.9 ?56.0  R Tibial - AH 58.2 ?56.0             H Reflex    Nerve H Lat Lat Hmax   ms ms   Left Right Ref. Left Right Ref.  Tibial - [place] 42.1 42.3 ?35.0 19.8 35.7 ?35.0        EMG full       EMG Summary Table    Spontaneous MUAP Recruitment  Muscle IA Fib PSW Fasc Other Amp Dur. Poly Pattern  R. Tibialis anterior Normal None None None _______ Normal Normal Normal Normal  R. Gastrocnemius (Medial head) Normal None None None _______ Normal Normal Normal Normal  R. Vastus lateralis Normal None None None _______ Normal Normal Normal Normal  R. Peroneus longus Normal None None None _______ Normal Normal Normal Normal  L. Tibialis anterior Normal None None None _______ Normal Normal Normal Normal  L. Gastrocnemius (Medial head) Normal None None None _______ Normal Normal Normal Normal  L. Vastus lateralis Normal None None None _______ Normal Normal Normal Normal  L. Lumbar paraspinals Normal None None None _______ Normal Normal Normal Normal  R. Lumbar paraspinals Normal None None None _______ Normal Normal Normal Normal

## 2017-10-21 ENCOUNTER — Other Ambulatory Visit: Payer: Self-pay | Admitting: Occupational Medicine

## 2017-10-21 ENCOUNTER — Ambulatory Visit: Payer: Self-pay

## 2017-10-21 DIAGNOSIS — M79642 Pain in left hand: Secondary | ICD-10-CM

## 2018-07-10 ENCOUNTER — Other Ambulatory Visit: Payer: Self-pay | Admitting: Family Medicine

## 2018-07-10 ENCOUNTER — Ambulatory Visit
Admission: RE | Admit: 2018-07-10 | Discharge: 2018-07-10 | Disposition: A | Discharge status: Home / Self Care | Payer: Medicare HMO | Source: Ambulatory Visit | Attending: Family Medicine | Admitting: Family Medicine

## 2018-07-10 DIAGNOSIS — R059 Cough, unspecified: Secondary | ICD-10-CM

## 2018-07-10 DIAGNOSIS — R05 Cough: Secondary | ICD-10-CM

## 2018-10-30 ENCOUNTER — Other Ambulatory Visit: Payer: Self-pay

## 2018-10-30 ENCOUNTER — Encounter (HOSPITAL_COMMUNITY): Payer: Self-pay | Admitting: Emergency Medicine

## 2018-10-30 ENCOUNTER — Emergency Department (HOSPITAL_COMMUNITY)
Admission: EM | Admit: 2018-10-30 | Discharge: 2018-10-30 | Disposition: A | Discharge status: Home / Self Care | Payer: Medicare HMO | Attending: Emergency Medicine | Admitting: Emergency Medicine

## 2018-10-30 ENCOUNTER — Emergency Department (HOSPITAL_COMMUNITY): Payer: Medicare HMO

## 2018-10-30 DIAGNOSIS — R079 Chest pain, unspecified: Secondary | ICD-10-CM | POA: Diagnosis present

## 2018-10-30 DIAGNOSIS — Z5321 Procedure and treatment not carried out due to patient leaving prior to being seen by health care provider: Secondary | ICD-10-CM | POA: Diagnosis not present

## 2018-10-30 LAB — BASIC METABOLIC PANEL
Anion gap: 10 (ref 5–15)
BUN: 22 mg/dL — ABNORMAL HIGH (ref 6–20)
CO2: 26 mmol/L (ref 22–32)
Calcium: 9.1 mg/dL (ref 8.9–10.3)
Chloride: 104 mmol/L (ref 98–111)
Creatinine, Ser: 1.28 mg/dL — ABNORMAL HIGH (ref 0.61–1.24)
GFR calc Af Amer: >60 mL/min (ref >60)
GFR calc non Af Amer: >60 mL/min (ref >60)
GLUCOSE: 114 mg/dL — AB (ref 70–99)
Potassium: 3.3 mmol/L — ABNORMAL LOW (ref 3.5–5.1)
Sodium: 140 mmol/L (ref 135–145)

## 2018-10-30 LAB — CBC
HCT: 40.7 % (ref 39.0–52.0)
Hemoglobin: 12.8 g/dL — ABNORMAL LOW (ref 13.0–17.0)
MCH: 28.5 pg (ref 26.0–34.0)
MCHC: 31.4 g/dL (ref 30.0–36.0)
MCV: 90.6 fL (ref 80.0–100.0)
Platelets: 292 10*3/uL (ref 150–400)
RBC: 4.49 MIL/uL (ref 4.22–5.81)
RDW: 13.6 % (ref 11.5–15.5)
WBC: 8.8 10*3/uL (ref 4.0–10.5)
nRBC: 0 % (ref 0.0–0.2)

## 2018-10-30 LAB — POCT I-STAT TROPONIN I: Troponin i, poc: 0 ng/mL (ref 0.00–0.08)

## 2018-10-30 NOTE — ED Triage Notes (Signed)
Pt c/o left sided chest pains that radiates to back since Saturday that is squeezing.

## 2019-02-14 ENCOUNTER — Other Ambulatory Visit: Payer: Self-pay | Admitting: Optometry

## 2019-02-14 DIAGNOSIS — D3161 Benign neoplasm of unspecified site of right orbit: Secondary | ICD-10-CM

## 2019-03-10 ENCOUNTER — Other Ambulatory Visit: Payer: Medicare HMO

## 2019-04-03 ENCOUNTER — Ambulatory Visit
Admission: RE | Admit: 2019-04-03 | Discharge: 2019-04-03 | Disposition: A | Discharge status: Home / Self Care | Payer: Medicare HMO | Source: Ambulatory Visit | Attending: Optometry | Admitting: Optometry

## 2019-04-03 ENCOUNTER — Other Ambulatory Visit: Payer: Self-pay

## 2019-04-03 DIAGNOSIS — D3161 Benign neoplasm of unspecified site of right orbit: Secondary | ICD-10-CM

## 2019-04-03 MED ORDER — GADOBENATE DIMEGLUMINE 529 MG/ML IV SOLN
18.0000 mL | Freq: Once | INTRAVENOUS | Status: AC | PRN
  Administered 2019-04-03: 18 mL via INTRAVENOUS

## 2019-07-17 ENCOUNTER — Other Ambulatory Visit: Payer: Self-pay | Admitting: Family Medicine

## 2019-07-17 DIAGNOSIS — R0789 Other chest pain: Secondary | ICD-10-CM

## 2019-07-17 DIAGNOSIS — I10 Essential (primary) hypertension: Secondary | ICD-10-CM

## 2019-10-16 ENCOUNTER — Other Ambulatory Visit: Payer: Self-pay

## 2019-10-16 ENCOUNTER — Ambulatory Visit: Payer: Medicare HMO | Attending: Internal Medicine

## 2019-10-16 DIAGNOSIS — Z23 Encounter for immunization: Secondary | ICD-10-CM

## 2019-10-16 NOTE — Progress Notes (Signed)
   Covid-19 Vaccination Clinic  Name:  Tizoc Oriley    MRN: [id number] DOB: May 03, 1961  10/16/2019  Mr. Bester was observed post Covid-19 immunization for 15 minutes without incidence. He was provided with Vaccine Information Sheet and instruction to access the V-Safe system.   Mr. Gorski was instructed to call 911 with any severe reactions post vaccine: Marland Kitchen Difficulty breathing  . Swelling of your face and throat  . A fast heartbeat  . A bad rash all over your body  . Dizziness and weakness    Immunizations Administered    Name Date Dose VIS Date Route   Moderna COVID-19 Vaccine 10/16/2019  2:05 PM 0.5 mL 07/31/2019 Intramuscular   Manufacturer: Moderna   Lot: [id number]   [place]:[id number]

## 2019-11-14 ENCOUNTER — Ambulatory Visit: Payer: Medicare HMO | Attending: Internal Medicine

## 2019-11-14 DIAGNOSIS — Z23 Encounter for immunization: Secondary | ICD-10-CM

## 2019-11-14 NOTE — Progress Notes (Signed)
   Covid-19 Vaccination Clinic  Name:  Benjamin Morales    MRN: [id number] DOB: [birth year]  11/14/2019  Benjamin Morales was observed post Covid-19 immunization for 15 minutes without incident. He was provided with Vaccine Information Sheet and instruction to access the V-Safe system.   Benjamin Morales was instructed to call 911 with any severe reactions post vaccine: Marland Kitchen Difficulty breathing  . Swelling of face and throat  . A fast heartbeat  . A bad rash all over body  . Dizziness and weakness   Immunizations Administered    Name Date Dose VIS Date Route   Moderna COVID-19 Vaccine 11/14/2019 12:20 PM 0.5 mL 07/31/2019 Intramuscular   Manufacturer: Moderna   Lot: [id number]   [place]:[id number]

## 2020-01-04 ENCOUNTER — Ambulatory Visit: Payer: Medicare HMO | Admitting: Cardiology

## 2020-02-01 NOTE — Progress Notes (Deleted)
Date:  02/01/2020   ID:  Benjamin Morales, DOB [birth year], MRN [id number]  PCP:  Lin Landsman, MD  Cardiologist:  Rex Kras, DO, [facility] Cardiology Providers: *** Cardiothoracic Surgeon: *** Electrophysiologist:  None   REASON FOR CONSULT: ***  REQUESTING PHYSICIAN:  Lin Landsman, [place],   [address]  No chief complaint on file.   HPI  Benjamin Morales is a 59 y.o. male who is being seen today for the evaluation of *** at the request of Lin Landsman, MD. Patient's past medical history and cardiac risk factors include: ***  Patient is unaccompanied at today's office visit.  Patient is accompanied by *** at today's office visit.    ***  History of  Denies prior history of coronary artery disease, myocardial infarction, congestive heart failure, deep venous thrombosis, pulmonary embolism, stroke, transient ischemic attack.  FUNCTIONAL STATUS: ***   ALLERGIES: Allergies  Allergen Reactions  . Thorazine [Chlorpromazine] Anaphylaxis    MEDICATION LIST PRIOR TO VISIT: No outpatient medications have been marked as taking for the 02/04/20 encounter (Appointment) with Terri Skains, [place], DO.     PAST MEDICAL HISTORY: Past Medical History:  Diagnosis Date  . HLD (hyperlipidemia)   . Hypertension   . Legally blind   . Low back pain     PAST SURGICAL HISTORY: Past Surgical History:  Procedure Laterality Date  . EYE SURGERY      FAMILY HISTORY: The patient family history includes Emphysema in his paternal uncle; Heart disease in his father; Stroke in his father.  SOCIAL HISTORY:  The patient  reports that he quit smoking about 6 years ago. His smoking use included cigarettes. He has a 30.00 pack-year smoking history. He has never used smokeless tobacco. He reports current alcohol use. He reports that he does not use drugs.  REVIEW OF SYSTEMS: ROS  PHYSICAL EXAM: Vitals with BMI 10/30/2018 08/22/2015 08/22/2015  Height '5\' 9"'$  - -  Weight - - -    BMI - - -  Systolic [phone]  Diastolic 97 69 84  Pulse 84 68 61    CONSTITUTIONAL: Well-developed and well-nourished. No acute distress.  SKIN: Skin is warm and dry. No rash noted. No cyanosis. No pallor. No jaundice HEAD: Normocephalic and atraumatic.  EYES: No scleral icterus MOUTH/THROAT: Moist oral membranes.  NECK: No JVD present. No thyromegaly noted. No carotid bruits  LYMPHATIC: No visible cervical adenopathy.  CHEST Normal respiratory effort. No intercostal retractions  LUNGS: *** No stridor. No wheezes. No rales.  CARDIOVASCULAR: *** ABDOMINAL: No apparent ascites.  EXTREMITIES: No peripheral edema  HEMATOLOGIC: No significant bruising NEUROLOGIC: Oriented to person, place, and time. Nonfocal. Normal muscle tone.  PSYCHIATRIC: Normal mood and affect. Normal behavior. Cooperative  RADIOLOGY: ***  CARDIAC DATABASE: Coronary artery bypass grafting: Year: Surgical anatomy:  Valve surgery:  Pacemaker/ICD in situ:  EKG: *** 10/30/18: Normal sinus rhythm Normal ECG  08/22/15: Normal sinus rhythm Cannot rule out Inferior infarct , age undetermined Abnormal ECG Baseline artifact limits interpretation   Echocardiogram: *** 08/22/2015: Left ventricle: The cavity size was normal. Systolic function was normal. The estimated ejection fraction was in the range of 55% to 60%.  Wall motion was normal; there were no regional wall motion abnormalities. Left ventricular diastolic function parameters were normal.   Stress Testing: ***  Heart Catheterization: ***  Carotid duplex: ***  Vascular imaging: US Abdomen (AAA screening): Ultrasound arterial duplex: Ultrasound venous duplex:   Holter:  14-Day mobile cardiac ambulatory telemetry:  LABORATORY DATA: CBC Latest Ref Rng & Units 10/30/2018 08/21/2015  WBC 4.0 - 10.5 K/uL 8.8 7.3  Hemoglobin 13.0 - 17.0 g/dL 12.8(L) 11.4(L)  Hematocrit 39.0 - 52.0 % 40.7 35.3(L)  Platelets 150 - 400 K/uL 292 221     CMP Latest Ref Rng & Units 10/30/2018 06/20/2016 08/21/2015  Glucose 70 - 99 mg/dL 114(H) - 145(H)  BUN 6 - 20 mg/dL 22(H) - 14  Creatinine 0.61 - 1.24 mg/dL 1.28(H) 1.40(H) 1.30(H)  Sodium 135 - 145 mmol/L 140 - 139  Potassium 3.5 - 5.1 mmol/L 3.3(L) - 3.8  Chloride 98 - 111 mmol/L 104 - 102  CO2 22 - 32 mmol/L 26 - 26  Calcium 8.9 - 10.3 mg/dL 9.1 - 9.4    Lipid Panel  No results found for: CHOL, TRIG, HDL, CHOLHDL, VLDL, LDLCALC, LDLDIRECT, LABVLDL  No results found for: HGBA1C No components found for: NTPROBNP No results found for: TSH  BMP No results for input(s): NA, K, CL, CO2, GLUCOSE, BUN, CREATININE, CALCIUM, GFRNONAA, GFRAA in the last 8760 hours.  CBC No results for input(s): WBC, RBC, HGB, HCT, PLT, MCV, MCH, MCHC, RDW, LYMPHSABS, MONOABS, EOSABS, BASOSABS in the last 168 hours.  Invalid input(s): NEUTRABS  HEMOGLOBIN A1C No results found for: HGBA1C, MPG  Cardiac Panel (last 3 results) No results for input(s): CKTOTAL, CKMB, TROPONINI, RELINDX in the last 8760 hours. No results for input(s): TROPIPOC in the last 8760 hours.  BNP (last 3 results) No results for input(s): PROBNP in the last 8760 hours.  TSH No results for input(s): TSH in the last 8760 hours.  CHOLESTEROL No results for input(s): CHOL in the last 8760 hours.  Hepatic Function Panel No results for input(s): PROT, ALBUMIN, AST, ALT, ALKPHOS, BILITOT, BILIDIR, IBILI in the last 8760 hours.  External Labs: *** 10/30/2018: Glucose 114, BUN/Cr 22/1.28. EGFR 60. Na/K 140/3.3. Rest of the CMP normal RBC 4.5. H/H 12.8/40.7. Platelets 292 ***HbA1C ***% Chol ***, TG ***, HDL ***, LDL *** ***TSH ***normal   IMPRESSION:  No diagnosis found.   RECOMMENDATIONS: Benjamin Morales is a 59 y.o. male whose past medical history and cardiac risk factors include: ***   FINAL MEDICATION LIST END OF ENCOUNTER: No orders of the defined types were placed in this encounter.   There are no  discontinued medications.   Current Outpatient Medications:  .  alprazolam (XANAX) 2 MG tablet, Take 2 mg by mouth every evening., Disp: , Rfl: 0 .  amLODipine (NORVASC) 10 MG tablet, Take 10 mg by mouth daily. , Disp: , Rfl:  .  aspirin EC 81 MG tablet, Take 1 tablet (81 mg total) by mouth daily., Disp: 30 tablet, Rfl: 0 .  atorvastatin (LIPITOR) 40 MG tablet, Take 40 mg by mouth daily., Disp: , Rfl: 1 .  carisoprodol (SOMA) 350 MG tablet, Take 350 mg by mouth 3 (three) times daily., Disp: , Rfl: 0 .  gabapentin (NEURONTIN) 300 MG capsule, Take 300 mg by mouth 3 (three) times daily., Disp: , Rfl:  .  losartan (COZAAR) 50 MG tablet, Take 1 tablet by mouth daily., Disp: , Rfl:  .  omeprazole (PRILOSEC) 20 MG capsule, Take 1 capsule by mouth daily., Disp: , Rfl:  .  oxyCODONE-acetaminophen (PERCOCET) 10-325 MG per tablet, Take 1-2 tablets by mouth every 6 (six) hours as needed., Disp: , Rfl: 0  No orders of the defined types were placed in this encounter.   There are no Patient Instructions on file for this visit.   --  Continue cardiac medications as reconciled in final medication list. --No follow-ups on file. Or sooner if needed. --Continue follow-up with your primary care physician regarding the management of your other chronic comorbid conditions.  Patient's questions and concerns were addressed to his satisfaction. He voices understanding of the instructions provided during this encounter.   This note was created using a voice recognition software as a result there may be grammatical errors inadvertently enclosed that do not reflect the nature of this encounter. Every attempt is made to correct such errors.  Rex Kras, [place], [facility]  Pager: [phone] Office: [phone]

## 2020-02-04 ENCOUNTER — Ambulatory Visit: Payer: Medicare HMO | Admitting: Cardiology

## 2020-02-07 ENCOUNTER — Emergency Department (HOSPITAL_COMMUNITY): Payer: Medicare HMO

## 2020-02-07 ENCOUNTER — Other Ambulatory Visit: Payer: Self-pay

## 2020-02-07 ENCOUNTER — Observation Stay (HOSPITAL_COMMUNITY)
Admission: AD | Admit: 2020-02-07 | Discharge: 2020-02-08 | Disposition: A | Discharge status: Home / Self Care | Payer: Medicare HMO | Attending: Internal Medicine | Admitting: Internal Medicine

## 2020-02-07 ENCOUNTER — Encounter (HOSPITAL_COMMUNITY): Payer: Self-pay | Admitting: Cardiology

## 2020-02-07 DIAGNOSIS — R0789 Other chest pain: Principal | ICD-10-CM | POA: Insufficient documentation

## 2020-02-07 DIAGNOSIS — Z20822 Contact with and (suspected) exposure to covid-19: Secondary | ICD-10-CM | POA: Diagnosis not present

## 2020-02-07 DIAGNOSIS — R079 Chest pain, unspecified: Secondary | ICD-10-CM | POA: Diagnosis not present

## 2020-02-07 DIAGNOSIS — Z7982 Long term (current) use of aspirin: Secondary | ICD-10-CM | POA: Insufficient documentation

## 2020-02-07 DIAGNOSIS — I200 Unstable angina: Secondary | ICD-10-CM | POA: Diagnosis not present

## 2020-02-07 DIAGNOSIS — F17290 Nicotine dependence, other tobacco product, uncomplicated: Secondary | ICD-10-CM | POA: Insufficient documentation

## 2020-02-07 DIAGNOSIS — I10 Essential (primary) hypertension: Secondary | ICD-10-CM | POA: Diagnosis not present

## 2020-02-07 LAB — HEPATIC FUNCTION PANEL
ALT: 48 U/L — ABNORMAL HIGH (ref 0–44)
AST: 29 U/L (ref 15–41)
Albumin: 4.6 g/dL (ref 3.5–5.0)
Alkaline Phosphatase: 53 U/L (ref 38–126)
Bilirubin, Direct: <0.1 mg/dL (ref 0.0–0.2)
Total Bilirubin: 0.7 mg/dL (ref 0.3–1.2)
Total Protein: 8.3 g/dL — ABNORMAL HIGH (ref 6.5–8.1)

## 2020-02-07 LAB — CREATININE, SERUM
Creatinine, Ser: 1.31 mg/dL — ABNORMAL HIGH (ref 0.61–1.24)
GFR calc Af Amer: >60 mL/min (ref >60)
GFR calc non Af Amer: 59 mL/min — ABNORMAL LOW (ref >60)

## 2020-02-07 LAB — COMPREHENSIVE METABOLIC PANEL
ALT: 47 U/L — ABNORMAL HIGH (ref 0–44)
AST: 35 U/L (ref 15–41)
Albumin: 4.4 g/dL (ref 3.5–5.0)
Alkaline Phosphatase: 49 U/L (ref 38–126)
Anion gap: 14 (ref 5–15)
BUN: 12 mg/dL (ref 6–20)
CO2: 24 mmol/L (ref 22–32)
Calcium: 9.5 mg/dL (ref 8.9–10.3)
Chloride: 100 mmol/L (ref 98–111)
Creatinine, Ser: 1.28 mg/dL — ABNORMAL HIGH (ref 0.61–1.24)
GFR calc Af Amer: >60 mL/min (ref >60)
GFR calc non Af Amer: >60 mL/min (ref >60)
Glucose, Bld: 110 mg/dL — ABNORMAL HIGH (ref 70–99)
Potassium: 3.3 mmol/L — ABNORMAL LOW (ref 3.5–5.1)
Sodium: 138 mmol/L (ref 135–145)
Total Bilirubin: 0.5 mg/dL (ref 0.3–1.2)
Total Protein: 7.9 g/dL (ref 6.5–8.1)

## 2020-02-07 LAB — CBC WITH DIFFERENTIAL/PLATELET
Abs Immature Granulocytes: 0.03 10*3/uL (ref 0.00–0.07)
Basophils Absolute: 0.1 10*3/uL (ref 0.0–0.1)
Basophils Relative: 1 %
Eosinophils Absolute: 0.1 10*3/uL (ref 0.0–0.5)
Eosinophils Relative: 1 %
HCT: 38.5 % — ABNORMAL LOW (ref 39.0–52.0)
Hemoglobin: 12.8 g/dL — ABNORMAL LOW (ref 13.0–17.0)
Immature Granulocytes: 0 %
Lymphocytes Relative: 15 %
Lymphs Abs: 1.4 10*3/uL (ref 0.7–4.0)
MCH: 28.4 pg (ref 26.0–34.0)
MCHC: 33.2 g/dL (ref 30.0–36.0)
MCV: 85.6 fL (ref 80.0–100.0)
Monocytes Absolute: 0.7 10*3/uL (ref 0.1–1.0)
Monocytes Relative: 7 %
Neutro Abs: 7.2 10*3/uL (ref 1.7–7.7)
Neutrophils Relative %: 76 %
Platelets: 293 10*3/uL (ref 150–400)
RBC: 4.5 MIL/uL (ref 4.22–5.81)
RDW: 13 % (ref 11.5–15.5)
WBC: 9.4 10*3/uL (ref 4.0–10.5)
nRBC: 0 % (ref 0.0–0.2)

## 2020-02-07 LAB — CBC
HCT: 40.7 % (ref 39.0–52.0)
Hemoglobin: 13.5 g/dL (ref 13.0–17.0)
MCH: 28.7 pg (ref 26.0–34.0)
MCHC: 33.2 g/dL (ref 30.0–36.0)
MCV: 86.6 fL (ref 80.0–100.0)
Platelets: 326 10*3/uL (ref 150–400)
RBC: 4.7 MIL/uL (ref 4.22–5.81)
RDW: 13.2 % (ref 11.5–15.5)
WBC: 10.6 10*3/uL — ABNORMAL HIGH (ref 4.0–10.5)
nRBC: 0 % (ref 0.0–0.2)

## 2020-02-07 LAB — TSH: TSH: 1.328 u[IU]/mL (ref 0.350–4.500)

## 2020-02-07 LAB — HEMOGLOBIN A1C
Hgb A1c MFr Bld: 6.5 % — ABNORMAL HIGH (ref 4.8–5.6)
Mean Plasma Glucose: 139.85 mg/dL

## 2020-02-07 LAB — T4, FREE: Free T4: 0.74 ng/dL (ref 0.61–1.12)

## 2020-02-07 LAB — TROPONIN I (HIGH SENSITIVITY)
Troponin I (High Sensitivity): 5 ng/L (ref <18)
Troponin I (High Sensitivity): 5 ng/L (ref <18)
Troponin I (High Sensitivity): 5 ng/L (ref <18)

## 2020-02-07 LAB — MAGNESIUM: Magnesium: 2 mg/dL (ref 1.7–2.4)

## 2020-02-07 LAB — HIV ANTIBODY (ROUTINE TESTING W REFLEX): HIV Screen 4th Generation wRfx: NONREACTIVE

## 2020-02-07 LAB — SARS CORONAVIRUS 2 BY RT PCR (HOSPITAL ORDER, PERFORMED IN ~~LOC~~ HOSPITAL LAB): SARS Coronavirus 2: NEGATIVE

## 2020-02-07 MED ORDER — SODIUM CHLORIDE 0.9% FLUSH
3.0000 mL | Freq: Two times a day (BID) | INTRAVENOUS | Status: DC
  Administered 2020-02-07 – 2020-02-08 (×2): 3 mL via INTRAVENOUS

## 2020-02-07 MED ORDER — OXYCODONE-ACETAMINOPHEN 5-325 MG PO TABS
1.0000 | ORAL_TABLET | Freq: Four times a day (QID) | ORAL | Status: DC | PRN
  Administered 2020-02-07 (×2): 2 via ORAL
  Filled 2020-02-07 (×2): qty 2

## 2020-02-07 MED ORDER — ALPRAZOLAM 0.5 MG PO TABS
1.0000 mg | ORAL_TABLET | Freq: Three times a day (TID) | ORAL | Status: DC | PRN
  Administered 2020-02-08: 1 mg via ORAL
  Filled 2020-02-07: qty 2

## 2020-02-07 MED ORDER — PANTOPRAZOLE SODIUM 40 MG PO TBEC
40.0000 mg | DELAYED_RELEASE_TABLET | Freq: Every day | ORAL | Status: DC
  Administered 2020-02-08: 40 mg via ORAL
  Filled 2020-02-07: qty 1

## 2020-02-07 MED ORDER — ASPIRIN 81 MG PO CHEW
324.0000 mg | CHEWABLE_TABLET | ORAL | Status: AC
  Administered 2020-02-07: 324 mg via ORAL
  Filled 2020-02-07: qty 4

## 2020-02-07 MED ORDER — ALBUTEROL SULFATE (2.5 MG/3ML) 0.083% IN NEBU
2.5000 mg | INHALATION_SOLUTION | Freq: Two times a day (BID) | RESPIRATORY_TRACT | Status: DC
  Filled 2020-02-07: qty 3

## 2020-02-07 MED ORDER — VITAMIN D 25 MCG (1000 UNIT) PO TABS: 10000.0000 [IU] | ORAL_TABLET | Freq: Every day | ORAL | Status: DC

## 2020-02-07 MED ORDER — ADULT MULTIVITAMIN W/MINERALS CH
2.0000 | ORAL_TABLET | Freq: Every day | ORAL | Status: DC
  Administered 2020-02-08: 2 via ORAL
  Filled 2020-02-07: qty 2

## 2020-02-07 MED ORDER — AMLODIPINE BESYLATE 10 MG PO TABS
10.0000 mg | ORAL_TABLET | Freq: Every day | ORAL | Status: DC
  Administered 2020-02-08: 10 mg via ORAL
  Filled 2020-02-07: qty 1

## 2020-02-07 MED ORDER — HEPARIN SODIUM (PORCINE) 5000 UNIT/ML IJ SOLN
5000.0000 [IU] | Freq: Three times a day (TID) | INTRAMUSCULAR | Status: DC
  Administered 2020-02-07 – 2020-02-08 (×3): 5000 [IU] via SUBCUTANEOUS
  Filled 2020-02-07 (×3): qty 1

## 2020-02-07 MED ORDER — SODIUM CHLORIDE 0.9% FLUSH: 3.0000 mL | INTRAVENOUS | Status: DC | PRN

## 2020-02-07 MED ORDER — NITROGLYCERIN 0.4 MG SL SUBL: 0.4000 mg | SUBLINGUAL_TABLET | SUBLINGUAL | Status: DC | PRN

## 2020-02-07 MED ORDER — FLUTICASONE PROPIONATE 50 MCG/ACT NA SUSP
1.0000 | Freq: Every evening | NASAL | Status: DC | PRN
  Filled 2020-02-07: qty 16

## 2020-02-07 MED ORDER — ATORVASTATIN CALCIUM 40 MG PO TABS
40.0000 mg | ORAL_TABLET | Freq: Every day | ORAL | Status: DC
  Administered 2020-02-08: 40 mg via ORAL
  Filled 2020-02-07: qty 1

## 2020-02-07 MED ORDER — VITAMIN B-12 100 MCG PO TABS
100.0000 ug | ORAL_TABLET | Freq: Every day | ORAL | Status: DC
  Administered 2020-02-08: 100 ug via ORAL
  Filled 2020-02-07: qty 1

## 2020-02-07 MED ORDER — QUETIAPINE FUMARATE 300 MG PO TABS
600.0000 mg | ORAL_TABLET | Freq: Every day | ORAL | Status: DC
  Administered 2020-02-07: 600 mg via ORAL
  Filled 2020-02-07 (×2): qty 2

## 2020-02-07 MED ORDER — ONDANSETRON HCL 4 MG/2ML IJ SOLN: 4.0000 mg | Freq: Four times a day (QID) | INTRAMUSCULAR | Status: DC | PRN

## 2020-02-07 MED ORDER — OXYCODONE HCL 5 MG PO TABS
5.0000 mg | ORAL_TABLET | Freq: Four times a day (QID) | ORAL | Status: DC | PRN
  Administered 2020-02-07 – 2020-02-08 (×2): 5 mg via ORAL
  Filled 2020-02-07 (×2): qty 1

## 2020-02-07 MED ORDER — ALBUTEROL SULFATE HFA 108 (90 BASE) MCG/ACT IN AERS
1.0000 | INHALATION_SPRAY | Freq: Two times a day (BID) | RESPIRATORY_TRACT | Status: DC | PRN
  Filled 2020-02-07: qty 6.7

## 2020-02-07 MED ORDER — LOSARTAN POTASSIUM 50 MG PO TABS
100.0000 mg | ORAL_TABLET | Freq: Every day | ORAL | Status: DC
  Administered 2020-02-08: 100 mg via ORAL
  Filled 2020-02-07: qty 2

## 2020-02-07 MED ORDER — ACETAMINOPHEN 325 MG PO TABS: 650.0000 mg | ORAL_TABLET | ORAL | Status: DC | PRN

## 2020-02-07 MED ORDER — ASPIRIN 300 MG RE SUPP: 300.0000 mg | RECTAL | Status: AC

## 2020-02-07 MED ORDER — SODIUM CHLORIDE 0.9 % IV SOLN: 250.0000 mL | INTRAVENOUS | Status: DC | PRN

## 2020-02-07 MED ORDER — METOPROLOL TARTRATE 25 MG PO TABS
25.0000 mg | ORAL_TABLET | Freq: Two times a day (BID) | ORAL | Status: DC
  Administered 2020-02-07 – 2020-02-08 (×2): 25 mg via ORAL
  Filled 2020-02-07 (×2): qty 1

## 2020-02-07 MED ORDER — MORPHINE SULFATE (PF) 2 MG/ML IV SOLN
2.0000 mg | Freq: Once | INTRAVENOUS | Status: AC
  Administered 2020-02-07: 2 mg via INTRAVENOUS
  Filled 2020-02-07: qty 1

## 2020-02-07 MED ORDER — OXYCODONE-ACETAMINOPHEN 10-325 MG PO TABS: 1.0000 | ORAL_TABLET | Freq: Four times a day (QID) | ORAL | Status: DC | PRN

## 2020-02-07 MED ORDER — OXYCODONE-ACETAMINOPHEN 5-325 MG PO TABS
1.0000 | ORAL_TABLET | Freq: Once | ORAL | Status: AC
  Administered 2020-02-07: 1 via ORAL
  Filled 2020-02-07: qty 1

## 2020-02-07 MED ORDER — ASPIRIN EC 81 MG PO TBEC: 81.0000 mg | DELAYED_RELEASE_TABLET | Freq: Every day | ORAL | Status: DC

## 2020-02-07 MED ORDER — ASPIRIN EC 81 MG PO TBEC
81.0000 mg | DELAYED_RELEASE_TABLET | Freq: Every day | ORAL | Status: DC
  Administered 2020-02-08: 81 mg via ORAL
  Filled 2020-02-07: qty 1

## 2020-02-07 NOTE — H&P (Addendum)
Cardiology Admission History and Physical:   Patient ID: Benjamin Morales MRN: [id number]; DOB: 1961-08-17   Admission date: 02/07/2020  Primary Care Provider: Lin Landsman, MD [place] Cardiologist: No primary care provider on file. new [place] Electrophysiologist:  None   Chief Complaint:  Chest pain  Patient Profile:   Benjamin Morales is a 59 y.o. male with hx of being legally blind, and PMH of HTN, HLD -he had undergone normal Stress test 2014 and chest pain again in 2016 but never had stress test.  Now presents with chest pain.  History of Present Illness:   Benjamin Morales with no prior cardiac hx though has been seen in hospital by cards for chest pain, neg ETT 2014 and echo 2016 with EF 55-60% no RWMA.  He was to have ETT but did not have it done. Other hx of HTN, HLD and legally blind,    He now presents with chest pain by EMS, pain radiates to left arm.  It began around 6:15 Am described as pressure/squeezing pain 10/10.  Pain improved after sl NTG.  Also stated he could not taste his breakfast this morning.  He has had chest pain for some time.  More constant.  Today he did not feel well and ate his breakfast then on way to work he developed squeezing chest pain mostly on Lt side, + N&V, was diaphoretic and SOB.  EMS called and with NTG pain improved.  Now feel like it is gone or better.  He now hs his chronic back pain on percocet.  He has not been on lipitor, he thought he was but has not been  No racing HR, no syncope, though he did pass out with second covid vaccine.   EKG:  The ECG that was done today was personally reviewed and demonstrates SR at 88 and no acute sT changes, stable EKG  Hs troponin 5 and 5 Na 138, K+ 3.3, glucose 110, Cr 1.28 (lasst in 2020 of 1.28 and 2017 1.40) ALT 47 WBC 9.4 Hgb 12.8  plts 293   Here BP 152/97 to 130/90 and back up to 158/101 though no BP meds today.  He has had morphine.  And now percocet.     Past Medical History:    Diagnosis Date  . HLD (hyperlipidemia)   . Hypertension   . Legally blind   . Low back pain     Past Surgical History:  Procedure Laterality Date  . EYE SURGERY       Medications Prior to Admission: Prior to Admission medications   Medication Sig Start Date End Date Taking? Authorizing Provider  albuterol (VENTOLIN HFA) 108 (90 Base) MCG/ACT inhaler Inhale 1 puff into the lungs daily as needed for wheezing. 12/01/19  Yes [provider]  alprazolam Duanne Moron) 2 MG tablet Take 2 mg by mouth 3 (three) times daily as needed for anxiety.  08/19/15  Yes [provider]  amLODipine (NORVASC) 10 MG tablet Take 10 mg by mouth daily.  02/02/15  Yes [provider]  aspirin EC 81 MG tablet Take 1 tablet (81 mg total) by mouth daily. 08/22/15  Yes Kelvin Cellar, MD  atorvastatin (LIPITOR) 40 MG tablet Take 40 mg by mouth daily. 07/26/15  Yes [provider]  fluticasone (FLONASE) 50 MCG/ACT nasal spray Place 1 spray into both nostrils at bedtime as needed for allergies. 09/22/17  Yes [provider]  losartan (COZAAR) 100 MG tablet Take 1 tablet by mouth daily.  02/02/15  Yes  [provider]  Multiple Vitamin (MULTIVITAMIN WITH MINERALS) TABS tablet Take 2 tablets by mouth daily.   Yes [provider]  omeprazole (PRILOSEC) 20 MG capsule Take 1 capsule by mouth daily. 01/26/15  Yes [provider]  oxyCODONE-acetaminophen (PERCOCET) 10-325 MG per tablet Take 1-2 tablets by mouth every 6 (six) hours as needed for pain.  02/25/15  Yes [provider]  Promethazine HCl 6.25 MG/5ML SOLN Take 5 mLs by mouth daily as needed. 12/27/19  Yes [provider]  QUEtiapine (SEROQUEL) 300 MG tablet Take 600 mg by mouth at bedtime. 11/28/19  Yes [provider]  vitamin B-12 (CYANOCOBALAMIN) 100 MCG tablet Take 100 mcg by mouth daily.   Yes [provider]  VITAMIN D PO Take 10,000 Units by mouth daily.   Yes  [provider]     Allergies:    Allergies  Allergen Reactions  . Thorazine [Chlorpromazine] Anaphylaxis    Social History:   Social History   Socioeconomic History  . Marital status: Single    Spouse name: Not on file  . Number of children: Not on file  . Years of education: Not on file  . Highest education level: Not on file  Occupational History  . Not on file  Tobacco Use  . Smoking status: Former Smoker    Packs/day: 1.00    Years: 30.00    Pack years: 30.00    Types: Cigarettes    Quit date: 08/30/2013    Years since quitting: 6.4  . Smokeless tobacco: Never Used  Vaping Use  . Vaping Use: Every day  . Substances: Nicotine, Flavoring  Substance and Sexual Activity  . Alcohol use: Yes    Alcohol/week: 0.0 standard drinks    Comment: Occasional  . Drug use: No  . Sexual activity: Not on file  Other Topics Concern  . Not on file  Social History Narrative  . Not on file   Social Determinants of Health   Financial Resource Strain:   . Difficulty of Paying Living Expenses:   Food Insecurity:   . Worried About Charity fundraiser in the Last Year:   . Arboriculturist in the Last Year:   Transportation Needs:   . Film/video editor (Medical):   Marland Kitchen Lack of Transportation (Non-Medical):   Physical Activity:   . Days of Exercise per Week:   . Minutes of Exercise per Session:   Stress:   . Feeling of Stress :   Social Connections:   . Frequency of Communication with Friends and Family:   . Frequency of Social Gatherings with Friends and Family:   . Attends Religious Services:   . Active Member of Clubs or Organizations:   . Attends Archivist Meetings:   Marland Kitchen Marital Status:   Intimate Partner Violence:   . Fear of Current or Ex-Partner:   . Emotionally Abused:   Marland Kitchen Physically Abused:   . Sexually Abused:     Family History:   The patient's family history includes Emphysema in his paternal uncle; Heart disease in his father; Stroke in  his father.    ROS:  Please see the history of present illness.  General:no colds or fevers, no weight changes Skin:no rashes or ulcers HEENT:no blurred vision, no congestion CV:see HPI PUL:see HPI GI:no diarrhea constipation or melena, no indigestion GU:no hematuria, no dysuria MS:no joint pain, no claudication Neuro:no syncope, no lightheadedness Endo:no diabetes, no thyroid disease  All other ROS reviewed  and negative.     Physical Exam/Data:   Vitals:   02/07/20 1102 02/07/20 1129 02/07/20 1159 02/07/20 1253  BP: (!) 162/92 (!) 143/102 (!) 130/94 (!) 134/102  Pulse: 85 83 82 82  Resp: (!) 25 19 12 14   Temp:      TempSrc:      SpO2: 99% 100% 99% 100%  Weight:      Height:        Intake/Output Summary (Last 24 hours) at 02/07/2020 1314 Last data filed at 02/07/2020 1149 Gross per 24 hour  Intake --  Output 600 ml  Net -600 ml   Last 3 Weights 02/07/2020 08/22/2015 08/21/2015  Weight (lbs) 197 lb 179 lb 4.8 oz 183 lb 4.8 oz  Weight (kg) 89.359 kg 81.33 kg 83.144 kg     Body mass index is 29.95 kg/m.  General:  Well nourished, well developed, in no acute distress HEENT: normal with legal blindness Lymph: no adenopathy Neck: no JVD Endocrine:  No thryomegaly Vascular: No carotid bruits; pedal pulses 2+ bilaterally   Cardiac:  normal S1, S2; RRR; no murmur gallup rub or click Lungs:  clear to auscultation bilaterally, no wheezing, rhonchi or rales  Abd: soft, nontender, no hepatomegaly  Ext: no lower ext edema Musculoskeletal:  No deformities, BUE and BLE strength normal and equal Skin: warm and dry  Neuro:  Alert and oriented X 3 MAE follows commands, no focal abnormalities noted Psych:  Normal affect     Relevant CV Studies: Echo 08/22/15 Study Conclusions   - Left ventricle: The cavity size was normal. Systolic function was  normal. The estimated ejection fraction was in the range of 55%  to 60%. Wall motion was normal; there were no regional wall   motion abnormalities. Left ventricular diastolic function  parameters were normal.   Laboratory Data:  High Sensitivity Troponin:   Recent Labs  Lab 02/07/20 0906 02/07/20 1057  TROPONINIHS 5 5      Chemistry Recent Labs  Lab 02/07/20 0906  NA 138  K 3.3*  CL 100  CO2 24  GLUCOSE 110*  BUN 12  CREATININE 1.28*  CALCIUM 9.5  GFRNONAA >60  GFRAA >60  ANIONGAP 14    Recent Labs  Lab 02/07/20 0906  PROT 7.9  ALBUMIN 4.4  AST 35  ALT 47*  ALKPHOS 49  BILITOT 0.5   Hematology Recent Labs  Lab 02/07/20 0906  WBC 9.4  RBC 4.50  HGB 12.8*  HCT 38.5*  MCV 85.6  MCH 28.4  MCHC 33.2  RDW 13.0  PLT 293   BNPNo results for input(s): BNP, PROBNP in the last 168 hours.  DDimer No results for input(s): DDIMER in the last 168 hours.   Radiology/Studies:  DG Chest Port 1 View  Result Date: 02/07/2020 CLINICAL DATA:  Fatigue, loss of appetite and nausea. EXAM: PORTABLE CHEST 1 VIEW COMPARISON:  10/30/2018 FINDINGS: The cardiac silhouette, mediastinal and hilar contours are within normal limits. The lungs are clear of an acute process. No infiltrates, edema or effusions. No worrisome pulmonary lesions. The bony thorax is intact. IMPRESSION: No acute cardiopulmonary findings. Electronically Signed   By: Marijo Sanes M.D.   On: 02/07/2020 09:46       HEAR Score (for undifferentiated chest pain):     6     Assessment and Plan:   1. Chest pain with neg troponin no acute EKG changes 2. HTN  Elevated 152/97 on arrival on amlodipine, cozaar  As outpt  3.  HLD on lipitor 40 mg no labs in Epic 4. Legallly Blind  Severity of Illness: The appropriate patient status for this patient is OBSERVATION. Observation status is judged to be reasonable and necessary in order to provide the required intensity of service to ensure the patient's safety. The patient's presenting symptoms, physical exam findings, and initial radiographic and laboratory data in the context of their  medical condition is felt to place them at decreased risk for further clinical deterioration. Furthermore, it is anticipated that the patient will be medically stable for discharge from the hospital within 2 midnights of admission. The following factors support the patient status of observation.   " The patient's presenting symptoms include significant chest pain with N&V, diaphoresis, SOB. " The physical exam findings include no acute findings. " The initial radiographic and laboratory data are normal CXR, EKG without acute changes.     For questions or updates, please contact [place] Please consult [email or web address] for contact info under     Signed, Benjamin Kicks, NP  02/07/2020 1:14 PM   Patient seen and examined  I agree with findings as noted by Benjamin Morales above Pt is a 59 yo with hx of HTN, HL   Normal stress test in 2014  Presents with CP  Pain has been coming on / off for days   Does admit that with breath is worse  No cough  No injury  No fevers   Came to ED   Eased of with NTG SL   On exam: Lungs are clear Chest  Minimal tenderness Cardiac RRR  No S3  No murmurs Abd is supple  Ext   No LE edema  Labs   Trop negative   EKG without ST changes   Plan   Admit   Rx with NTG and Rx BP  Plan for evaluation tomorrow  Possible CT which would also evaluate chest pathology   Dorris Carnes MD

## 2020-02-07 NOTE — ED Provider Notes (Signed)
[facility] EMERGENCY DEPARTMENT Provider Note   CSN: [id number] Arrival date & time: 02/07/20  1829     History Chief Complaint  Patient presents with  . Chest Pain    Benjamin Morales is a 59 y.o. male.  Patient complains of chest discomfort.  Patient states has been hurting off and on for 2 months and got worse today he is also had some shortness of breath and some sweating  The history is provided by the patient. No language interpreter was used.  Chest Pain Pain location:  L chest Pain quality: aching   Pain radiates to:  Does not radiate Pain severity:  Moderate Onset quality:  Sudden Timing:  Constant Progression:  Worsening Context: not breathing   Relieved by:  Nothing Worsened by:  Nothing Ineffective treatments:  None tried Associated symptoms: no abdominal pain, no back pain, no cough, no fatigue and no headache        Past Medical History:  Diagnosis Date  . HLD (hyperlipidemia)   . Hypertension   . Legally blind   . Low back pain     Patient Active Problem List   Diagnosis Date Noted  . Paresthesia 06/25/2016  . Chest pain 08/21/2015  . Insomnia 08/21/2015  . Hypertension 08/21/2015  . GERD (gastroesophageal reflux disease) 08/21/2015  . Renal mass, right 03/13/2015  . Other chest pain 03/11/2015    Past Surgical History:  Procedure Laterality Date  . EYE SURGERY         Family History  Problem Relation Age of Onset  . Emphysema Paternal Uncle        smoked  . Heart disease Father   . Stroke Father     Social History   Tobacco Use  . Smoking status: Former Smoker    Packs/day: 1.00    Years: 30.00    Pack years: 30.00    Types: Cigarettes    Quit date: 08/30/2013    Years since quitting: 6.4  . Smokeless tobacco: Never Used  Vaping Use  . Vaping Use: Every day  . Substances: Nicotine, Flavoring  Substance Use Topics  . Alcohol use: Yes    Alcohol/week: 0.0 standard drinks    Comment: Occasional  . Drug  use: No    Home Medications Prior to Admission medications   Medication Sig Start Date End Date Taking? Authorizing Provider  albuterol (VENTOLIN HFA) 108 (90 Base) MCG/ACT inhaler Inhale 1 puff into the lungs 2 (two) times daily as needed for wheezing.  12/01/19  Yes [provider]  alprazolam Duanne Moron) 2 MG tablet Take 2 mg by mouth 3 (three) times daily as needed for anxiety.  08/19/15  Yes [provider]  amLODipine (NORVASC) 10 MG tablet Take 10 mg by mouth daily.  02/02/15  Yes [provider]  aspirin EC 81 MG tablet Take 1 tablet (81 mg total) by mouth daily. 08/22/15  Yes Kelvin Cellar, MD  atorvastatin (LIPITOR) 40 MG tablet Take 40 mg by mouth daily. 07/26/15  Yes [provider]  fluticasone (FLONASE) 50 MCG/ACT nasal spray Place 1 spray into both nostrils at bedtime as needed for allergies. 09/22/17  Yes [provider]  losartan (COZAAR) 100 MG tablet Take 100 mg by mouth daily.  02/02/15  Yes [provider]  Multiple Vitamin (MULTIVITAMIN WITH MINERALS) TABS tablet Take 2 tablets by mouth daily.   Yes [provider]  omeprazole (PRILOSEC) 20 MG capsule Take 20 mg by mouth daily.  01/26/15  Yes [provider]  oxyCODONE-acetaminophen (PERCOCET) 10-325 MG per tablet Take 1-2 tablets by mouth every 6 (six) hours as needed for pain.  02/25/15  Yes [provider]  Promethazine HCl 6.25 MG/5ML SOLN Take 6.25 mg by mouth daily as needed (cough).  12/27/19  Yes [provider]  QUEtiapine (SEROQUEL) 300 MG tablet Take 600 mg by mouth at bedtime. 11/28/19  Yes [provider]  vitamin B-12 (CYANOCOBALAMIN) 100 MCG tablet Take 100 mcg by mouth daily.   Yes [provider]  VITAMIN D PO Take 10,000 Units by mouth daily.   Yes [provider]    Allergies    Thorazine [chlorpromazine]  Review of Systems   Review of Systems  Constitutional: Negative for appetite change and  fatigue.  HENT: Negative for congestion, ear discharge and sinus pressure.   Eyes: Negative for discharge.  Respiratory: Negative for cough.   Cardiovascular: Positive for chest pain.  Gastrointestinal: Negative for abdominal pain and diarrhea.  Genitourinary: Negative for frequency and hematuria.  Musculoskeletal: Negative for back pain.  Skin: Negative for rash.  Neurological: Negative for seizures and headaches.  Psychiatric/Behavioral: Negative for hallucinations.    Physical Exam Updated Vital Signs BP (!) 137/121 (BP Location: Right Arm)   Pulse 84   Temp 98.3 F (36.8 C) (Oral)   Resp 14   Ht 5\' 8"  (1.727 m)   Wt 89.4 kg   SpO2 99%   BMI 29.95 kg/m   Physical Exam Vitals and nursing note reviewed.  Constitutional:      Appearance: He is well-developed.  HENT:     Head: Normocephalic.     Nose: Nose normal.  Eyes:     General: No scleral icterus.    Conjunctiva/sclera: Conjunctivae normal.  Neck:     Thyroid: No thyromegaly.  Cardiovascular:     Rate and Rhythm: Normal rate and regular rhythm.     Heart sounds: No murmur heard.  No friction rub. No gallop.   Pulmonary:     Breath sounds: No stridor. No wheezing or rales.  Chest:     Chest wall: No tenderness.  Abdominal:     General: There is no distension.     Tenderness: There is no abdominal tenderness. There is no rebound.  Musculoskeletal:        General: Normal range of motion.     Cervical back: Neck supple.  Lymphadenopathy:     Cervical: No cervical adenopathy.  Skin:    Findings: No erythema or rash.  Neurological:     Mental Status: He is alert and oriented to person, place, and time.     Motor: No abnormal muscle tone.     Coordination: Coordination normal.  Psychiatric:        Behavior: Behavior normal.     ED Results / Procedures / Treatments   Labs (all labs ordered are listed, but only abnormal results are displayed) Labs Reviewed  CBC WITH DIFFERENTIAL/PLATELET - Abnormal;  Notable for the following components:      Result Value   Hemoglobin 12.8 (*)    HCT 38.5 (*)    All other components within normal limits  COMPREHENSIVE METABOLIC PANEL - Abnormal; Notable for the following components:   Potassium 3.3 (*)    Glucose, Bld 110 (*)    Creatinine, Ser 1.28 (*)    ALT 47 (*)    All other components within normal limits  TROPONIN I (HIGH SENSITIVITY)  TROPONIN I (HIGH SENSITIVITY)  TROPONIN I (HIGH SENSITIVITY)    EKG EKG Interpretation  Date/Time:  Thursday February 07 2020 08:35:27 EDT Ventricular Rate:  86 PR Interval:    QRS Duration: 90 QT Interval:  368 QTC Calculation: 441 R Axis:   29 Text Interpretation: Sinus rhythm Confirmed by Milton Ferguson [phone]) on 02/07/2020 8:51:55 AM   Radiology DG Chest Port 1 View  Result Date: 02/07/2020 CLINICAL DATA:  Fatigue, loss of appetite and nausea. EXAM: PORTABLE CHEST 1 VIEW COMPARISON:  10/30/2018 FINDINGS: The cardiac silhouette, mediastinal and hilar contours are within normal limits. The lungs are clear of an acute process. No infiltrates, edema or effusions. No worrisome pulmonary lesions. The bony thorax is intact. IMPRESSION: No acute cardiopulmonary findings. Electronically Signed   By: Marijo Sanes M.D.   On: 02/07/2020 09:46    Procedures Procedures (including critical care time)  Medications Ordered in ED Medications  nitroGLYCERIN (NITROSTAT) SL tablet 0.4 mg (has no administration in time range)  morphine 2 MG/ML injection 2 mg (2 mg Intravenous Given 02/07/20 0915)  oxyCODONE-acetaminophen (PERCOCET/ROXICET) 5-325 MG per tablet 1 tablet (1 tablet Oral Given 02/07/20 1405)    ED Course  I have reviewed the triage vital signs and the nursing notes.  Pertinent labs & imaging results that were available during my care of the patient were reviewed by me and considered in my medical decision making (see chart for details).    MDM Rules/Calculators/A&P                          Patient  with chest pain.  Troponin x2 -.  Patient will be seen by cardiology and possibly admitted for further work-up        This patient presents to the ED for concern of chest pain this involves an extensive number of treatment options, and is a complaint that carries with it a high risk of complications and morbidity.  The differential diagnosis includes MI PE   Lab Tests:   I Ordered, reviewed, and interpreted labs, which included CBC chemistries troponin unremarkable  Medicines ordered:   I ordered medication nitroglycerin and morphine for pain  Imaging Studies ordered:   I ordered imaging studies which included chest x-ray and  I independently visualized and interpreted imaging which showed unremarkable  Additional history obtained:   Additional history obtained from records  Previous records obtained and reviewed  Consultations Obtained:   I consulted cardiology and discussed lab and imaging findings  Reevaluation:  After the interventions stated above, I reevaluated the patient and found improved  Critical Interventions:  .   Final Clinical Impression(s) / ED Diagnoses Final diagnoses:  Atypical chest pain    Rx / DC Orders ED Discharge Orders    None       Milton Ferguson, MD 02/07/20 1454

## 2020-02-07 NOTE — ED Triage Notes (Signed)
Patient arrives from his workplace via [place] with c/o chest pain that radiates to his L arm that began around 0615 this morning. He describes the pain as as a pressure/squeezing that was initially a 10/10, but has since decreased to a 5/10 after 3 0.4 mg of Nitroglycerin given via [facility]. Patient also is SOB. Patient also reports that he couldn't taste his breakfast this morning. Patient had both rounds of the COVID vaccine back in March of 2021.  166/108 96 HR 100% RA  324 mg ASA  0.4 mg Nitroglycerin x3

## 2020-02-08 DIAGNOSIS — R0789 Other chest pain: Secondary | ICD-10-CM | POA: Diagnosis not present

## 2020-02-08 DIAGNOSIS — I10 Essential (primary) hypertension: Secondary | ICD-10-CM | POA: Diagnosis not present

## 2020-02-08 DIAGNOSIS — I200 Unstable angina: Secondary | ICD-10-CM | POA: Diagnosis not present

## 2020-02-08 DIAGNOSIS — R079 Chest pain, unspecified: Secondary | ICD-10-CM | POA: Diagnosis not present

## 2020-02-08 DIAGNOSIS — F17290 Nicotine dependence, other tobacco product, uncomplicated: Secondary | ICD-10-CM | POA: Diagnosis not present

## 2020-02-08 LAB — CBC
HCT: 39 % (ref 39.0–52.0)
Hemoglobin: 12.7 g/dL — ABNORMAL LOW (ref 13.0–17.0)
MCH: 28.1 pg (ref 26.0–34.0)
MCHC: 32.6 g/dL (ref 30.0–36.0)
MCV: 86.3 fL (ref 80.0–100.0)
Platelets: 292 10*3/uL (ref 150–400)
RBC: 4.52 MIL/uL (ref 4.22–5.81)
RDW: 13.1 % (ref 11.5–15.5)
WBC: 8.1 10*3/uL (ref 4.0–10.5)
nRBC: 0 % (ref 0.0–0.2)

## 2020-02-08 LAB — LIPID PANEL
Cholesterol: 172 mg/dL (ref 0–200)
HDL: 34 mg/dL — ABNORMAL LOW (ref >40)
LDL Cholesterol: 67 mg/dL (ref 0–99)
Total CHOL/HDL Ratio: 5.1 RATIO
Triglycerides: 355 mg/dL — ABNORMAL HIGH (ref <150)
VLDL: 71 mg/dL — ABNORMAL HIGH (ref 0–40)

## 2020-02-08 LAB — BASIC METABOLIC PANEL
Anion gap: 13 (ref 5–15)
BUN: 15 mg/dL (ref 6–20)
CO2: 26 mmol/L (ref 22–32)
Calcium: 9.3 mg/dL (ref 8.9–10.3)
Chloride: 99 mmol/L (ref 98–111)
Creatinine, Ser: 1.37 mg/dL — ABNORMAL HIGH (ref 0.61–1.24)
GFR calc Af Amer: >60 mL/min (ref >60)
GFR calc non Af Amer: 56 mL/min — ABNORMAL LOW (ref >60)
Glucose, Bld: 135 mg/dL — ABNORMAL HIGH (ref 70–99)
Potassium: 3.1 mmol/L — ABNORMAL LOW (ref 3.5–5.1)
Sodium: 138 mmol/L (ref 135–145)

## 2020-02-08 MED ORDER — ALBUTEROL SULFATE (2.5 MG/3ML) 0.083% IN NEBU: 2.5000 mg | INHALATION_SOLUTION | Freq: Four times a day (QID) | RESPIRATORY_TRACT | Status: DC | PRN

## 2020-02-08 MED ORDER — ATORVASTATIN CALCIUM 40 MG PO TABS: 40.0000 mg | ORAL_TABLET | Freq: Every day | ORAL | 6 refills | Status: DC

## 2020-02-08 MED ORDER — NITROGLYCERIN 0.4 MG SL SUBL: 0.4000 mg | SUBLINGUAL_TABLET | SUBLINGUAL | 3 refills | Status: AC | PRN

## 2020-02-08 NOTE — Progress Notes (Signed)
Nutrition Brief Note  RD consulted for low carbohydrate diet education (Hgb A1c: 6.5)  Wt Readings from Last 15 Encounters:  02/07/20 90.4 kg  08/22/15 81.3 kg  04/10/15 74.8 kg  03/11/15 80.3 kg   Benjamin Morales is a 59 y.o. male with hx of being legally blind, and PMH of HTN, HLD -he had undergone normal Stress test 2014 and chest pain again in 2016 but never had stress test.  Now presents with chest pain.  Reviewed I/O's: -1.8 L x 24 hours  UOP: 1.9 L x 24 hours  Spoke with pt at bedside, who voiced extreme frustration that he was NPO for testing ("they won't let me eat; they keep ordering these tests"). He reports he had a good appetite PTA; pt generally consumes 3 meals per day (Breakfast: egg and cheese croissant; Lunch: chicken salad sadwich with cake; Dinner: meatloaf, mashed potatoes, and greens). Pt consumes mostly water per his report.   Pt denies being told about about diabetes/pre-diabetes diagnosis or rationale as to why he was in the hospital. He denies any history of blood sugar control issues in the past. Pt reports he does not have any questions or concerns at this time. He was not very interested in speaking with RD at time of visit.   Current diet order is Heart Healthy, patient is consuming approximately n/a% of meals at this time. Labs and medications reviewed.   No nutrition interventions warranted at this time. If nutrition issues arise, please consult RD.   Loistine Chance, RD, LDN, [place] Registered Dietitian II Certified Diabetes Care and Education Specialist Please refer to [facility] for RD and/or RD on-call/weekend/after hours pager

## 2020-02-08 NOTE — Progress Notes (Signed)
Pt was stressed and wanted food but also some financial issues.  So pt signed out AMA.  He tells me he is to see [facility] on the 24th. With Dr. Terri Skains.  I will send in lipitor prescription.

## 2020-02-08 NOTE — Progress Notes (Signed)
Pt insisting on leaving AMA, IV taken out per pt request, AMA papers signed, educated pt that leaving before his CT was not advisable, continued to insist he had waited to long and had pressing personal matters that he needed to attend to, pt taken to front entrance via w/c.

## 2020-02-08 NOTE — Discharge Summary (Signed)
Discharge Summary    Patient ID: Benjamin Morales MRN: [id number]; DOB: 06/10/1961  Admit date: 02/07/2020 Discharge date: 02/08/2020  Primary Care Provider: Lin Landsman, MD  Primary Cardiologist: No primary care provider on file.  Pt to see [facility] cardiology 02/21/20 Primary Electrophysiologist:  None   Discharge Diagnoses    Principal Problem:   Chest pain of uncertain etiology Active Problems:   Hypertension   Unstable angina [facility])  Benjamin Morales Left AMA  Diagnostic Studies/Procedures    none _____________   History of Present Illness     Benjamin Morales is a 59 y.o. male with  hx of being legally blind, and PMH of HTN, HLD -he had undergone normal Stress test 2014 and chest pain again in 2016 but never had stress test.   also chronic back pain on percocet.  He had not been on lipitor as he thought.   He presented to ER 02/07/20 with chest pain with radiation to Left arm.  Began at 6:15 AM and a pressure squeezing pain rated 10/10.  He culd hardly breathe due pain with breathing.  He vomited X 1.  He stated he was diaphoretic and SOB.  No colds or fevers, no syncope though with second COVID vaccine he did pass out.  Called EMS and eased somewhat with sl NTG.  He had been having some chest discomfort for several days.   EKG:  The ECG that was done today was personally reviewed and demonstrates SR at 88 and no acute sT changes, stable EKG  Troponin 5 and repeat 5.   Cr 1.28 though this is his normal.   His BP was elevated 152/97    Admitted to Obs for eval and testing.   Hospital Course     Consultants: none   Follow up troponin was 5, on exam chest was better only slight pressure. No SOB.  Cardiac CTA was ordered and prior to having this done after having been NPO for long time pt signed out AMA, he also has home issues to deal with.  Before he left he stated he had appt with [facility] cardiology on June 24th.  This appointment is documented in [place]. Pt did not wait for  AVS.     Did the patient have an acute coronary syndrome (MI, NSTEMI, STEMI, etc) this admission?:  No                               Did the patient have a percutaneous coronary intervention (stent / angioplasty)?:  No.   _____________  Discharge Vitals Blood pressure 115/75, pulse 67, temperature 97.6 F (36.4 C), temperature source Oral, resp. rate 12, height 5\' 8"  (1.727 m), weight 90.4 kg, SpO2 98 %.  Filed Weights   02/07/20 0844 02/07/20 1740  Weight: 89.4 kg 90.4 kg    Labs & Radiologic Studies    CBC Recent Labs    02/07/20 0906 02/07/20 0906 02/07/20 1700 02/08/20 0450  WBC 9.4   < > 10.6* 8.1  NEUTROABS 7.2  --   --   --   HGB 12.8*   < > 13.5 12.7*  HCT 38.5*   < > 40.7 39.0  MCV 85.6   < > 86.6 86.3  PLT 293   < > 326 292   < > = values in this interval not displayed.   Basic Metabolic Panel Recent Labs    02/07/20 0906 02/07/20 0906 02/07/20 1700 02/08/20 0450  NA 138  --   --  138  K 3.3*  --   --  3.1*  CL 100  --   --  99  CO2 24  --   --  26  GLUCOSE 110*  --   --  135*  BUN 12  --   --  15  CREATININE 1.28*   < > 1.31* 1.37*  CALCIUM 9.5  --   --  9.3  MG  --   --  2.0  --    < > = values in this interval not displayed.   Liver Function Tests Recent Labs    02/07/20 0906 02/07/20 1700  AST 35 29  ALT 47* 48*  ALKPHOS 49 53  BILITOT 0.5 0.7  PROT 7.9 8.3*  ALBUMIN 4.4 4.6   No results for input(s): LIPASE, AMYLASE in the last 72 hours. High Sensitivity Troponin:   Recent Labs  Lab 02/07/20 0906 02/07/20 1057 02/07/20 1430  TROPONINIHS 5 5 5     BNP Invalid input(s): POCBNP D-Dimer No results for input(s): DDIMER in the last 72 hours. Hemoglobin A1C Recent Labs    02/07/20 1700  HGBA1C 6.5*   Fasting Lipid Panel Recent Labs    02/08/20 0450  CHOL 172  HDL 34*  LDLCALC 67  TRIG 355*  CHOLHDL 5.1   Thyroid Function Tests Recent Labs    02/07/20 1700  TSH 1.328   _____________  DG Chest Port 1 View  Result  Date: 02/07/2020 CLINICAL DATA:  Fatigue, loss of appetite and nausea. EXAM: PORTABLE CHEST 1 VIEW COMPARISON:  10/30/2018 FINDINGS: The cardiac silhouette, mediastinal and hilar contours are within normal limits. The lungs are clear of an acute process. No infiltrates, edema or effusions. No worrisome pulmonary lesions. The bony thorax is intact. IMPRESSION: No acute cardiopulmonary findings. Electronically Signed   By: Marijo Sanes M.D.   On: 02/07/2020 09:46   Disposition   Pt signed out [place]   Pt has appointment 02/21/20 with [facility] cardiology   Follow-up Information    Rex Kras, DO Follow up on 02/21/2020.   Specialties: Cardiology, Radiology, Vascular Surgery Why: keep appointment Contact information: [place] [address] [phone]                Discharge Medications   Allergies as of 02/08/2020      Reactions   Thorazine [chlorpromazine] Anaphylaxis      Medication List    TAKE these medications   albuterol 108 (90 Base) MCG/ACT inhaler Commonly known as: VENTOLIN HFA Inhale 1 puff into the lungs 2 (two) times daily as needed for wheezing.   alprazolam 2 MG tablet Commonly known as: XANAX Take 2 mg by mouth 3 (three) times daily as needed for anxiety.   amLODipine 10 MG tablet Commonly known as: NORVASC Take 10 mg by mouth daily.   aspirin EC 81 MG tablet Take 1 tablet (81 mg total) by mouth daily.   atorvastatin 40 MG tablet Commonly known as: LIPITOR Take 1 tablet (40 mg total) by mouth daily.   fluticasone 50 MCG/ACT nasal spray Commonly known as: FLONASE Place 1 spray into both nostrils at bedtime as needed for allergies.   losartan 100 MG tablet Commonly known as: COZAAR Take 100 mg by mouth daily.   multivitamin with minerals Tabs tablet Take 2 tablets by mouth daily.   nitroGLYCERIN 0.4 MG SL tablet Commonly known as: NITROSTAT Place 1  tablet (0.4 mg total) under the  tongue every 5 (five) minutes x 3 doses as needed for chest pain.   omeprazole 20 MG capsule Commonly known as: PRILOSEC Take 20 mg by mouth daily.   oxyCODONE-acetaminophen 10-325 MG tablet Commonly known as: PERCOCET Take 1-2 tablets by mouth every 6 (six) hours as needed for pain.   Promethazine HCl 6.25 MG/5ML Soln Take 6.25 mg by mouth daily as needed (cough).   QUEtiapine 300 MG tablet Commonly known as: SEROQUEL Take 600 mg by mouth at bedtime.   vitamin B-12 100 MCG tablet Commonly known as: CYANOCOBALAMIN Take 100 mcg by mouth daily.   VITAMIN D PO Take 10,000 Units by mouth daily.          Outstanding Labs/Studies   Per [facility] Cardiology  Duration of Discharge Encounter   Greater than 30 minutes including physician time.  Signed, Cecilie Kicks, NP 02/08/2020, 2:03 PM

## 2020-02-08 NOTE — Progress Notes (Signed)
Progress Note  Patient Name: Benjamin Morales Date of Encounter: 02/08/2020  [facility] Cardiologist: New  Subjective   Pt says his chest is feelilng better Sl pressure   No SOB  Inpatient Medications    Scheduled Meds: . albuterol  2.5 mg Nebulization BID PC  . amLODipine  10 mg Oral Daily  . aspirin EC  81 mg Oral Daily  . atorvastatin  40 mg Oral Daily  . heparin  5,000 Units Subcutaneous Q8H  . losartan  100 mg Oral Daily  . metoprolol tartrate  25 mg Oral BID  . multivitamin with minerals  2 tablet Oral Daily  . pantoprazole  40 mg Oral Daily  . QUEtiapine  600 mg Oral QHS  . sodium chloride flush  3 mL Intravenous Q12H  . vitamin B-12  100 mcg Oral Daily   Continuous Infusions: . sodium chloride     PRN Meds: sodium chloride, acetaminophen, alprazolam, fluticasone, nitroGLYCERIN, ondansetron (ZOFRAN) IV, oxyCODONE-acetaminophen **AND** oxyCODONE, sodium chloride flush   Vital Signs    Vitals:   02/07/20 1740 02/07/20 2045 02/08/20 0012 02/08/20 0416  BP: (!) 169/104 (!) 149/93 119/72 (!) 140/95  Pulse: 84 84 66 79  Resp: 20 16 13 14   Temp: 98.8 F (37.1 C) 97.8 F (36.6 C) 98.4 F (36.9 C) 98.2 F (36.8 C)  TempSrc: Oral Oral Oral Oral  SpO2: 99% 94% 96% 96%  Weight: 90.4 kg     Height: 5\' 8"  (1.727 m)       Intake/Output Summary (Last 24 hours) at 02/08/2020 0731 Last data filed at 02/08/2020 0600 Gross per 24 hour  Intake 3 ml  Output 1850 ml  Net -1847 ml   Last 3 Weights 02/07/2020 02/07/2020 08/22/2015  Weight (lbs) 199 lb 3.2 oz 197 lb 179 lb 4.8 oz  Weight (kg) 90.357 kg 89.359 kg 81.33 kg      Telemetry    SR  - Personally Reviewed  ECG     Personally Reviewed  Physical Exam   GEN: No acute distress.   Neck: No JVD Cardiac: RRR, no murmurs.  Respiratory: Clear to auscultation bilaterally. GI: Soft, nontender, non-distended  MS: No edema; No deformity. Neuro: Deferred  Psych: Normal affect   Labs    High Sensitivity  Troponin:   Recent Labs  Lab 02/07/20 0906 02/07/20 1057 02/07/20 1430  TROPONINIHS 5 5 5       Chemistry Recent Labs  Lab 02/07/20 0906 02/07/20 1700 02/08/20 0450  NA 138  --  138  K 3.3*  --  3.1*  CL 100  --  99  CO2 24  --  26  GLUCOSE 110*  --  135*  BUN 12  --  15  CREATININE 1.28* 1.31* 1.37*  CALCIUM 9.5  --  9.3  PROT 7.9 8.3*  --   ALBUMIN 4.4 4.6  --   AST 35 29  --   ALT 47* 48*  --   ALKPHOS 49 53  --   BILITOT 0.5 0.7  --   GFRNONAA >60 59* 56*  GFRAA >60 >60 >60  ANIONGAP 14  --  13     Hematology Recent Labs  Lab 02/07/20 0906 02/07/20 1700 02/08/20 0450  WBC 9.4 10.6* 8.1  RBC 4.50 4.70 4.52  HGB 12.8* 13.5 12.7*  HCT 38.5* 40.7 39.0  MCV 85.6 86.6 86.3  MCH 28.4 28.7 28.1  MCHC 33.2 33.2 32.6  RDW 13.0 13.2 13.1  PLT 293 326 292  BNPNo results for input(s): BNP, PROBNP in the last 168 hours.   DDimer No results for input(s): DDIMER in the last 168 hours.   Radiology    DG Chest Port 1 View  Result Date: 02/07/2020 CLINICAL DATA:  Fatigue, loss of appetite and nausea. EXAM: PORTABLE CHEST 1 VIEW COMPARISON:  10/30/2018 FINDINGS: The cardiac silhouette, mediastinal and hilar contours are within normal limits. The lungs are clear of an acute process. No infiltrates, edema or effusions. No worrisome pulmonary lesions. The bony thorax is intact. IMPRESSION: No acute cardiopulmonary findings. Electronically Signed   By: Marijo Sanes M.D.   On: 02/07/2020 09:46    Cardiac Studies     Patient Profile     59 y.o. male presents with CP   Assessment & Plan    1  Chest pain   Atypical but responsive to NTG   He has presented in past with similar symtoms   Normal stress test in 2016  Then in 2016 plan was for myovue which he never had. Given multiple presentations and Risk factors for CAD (HTN, HL) I would recomm defining with coronary CT angiogram    2  HTN  Blood pressure is better today   Follow for now    3  HL   Lipids:   LDL  67  HDL 34  Trig 355  A1C is 6.5   Would set up for dietary counselling     For questions or updates, please contact [facility] Please consult [email or web address] for contact info under        Signed, Dorris Carnes, MD  02/08/2020, 7:31 AM

## 2020-02-08 NOTE — Discharge Instructions (Signed)
Return to hospital if pain returns.

## 2020-02-08 NOTE — Progress Notes (Signed)
Pt requesting to leave AMA. Cecilie Kicks notified.

## 2020-02-21 ENCOUNTER — Ambulatory Visit: Payer: Medicare HMO | Admitting: Cardiology

## 2020-02-22 ENCOUNTER — Ambulatory Visit: Payer: Medicare HMO | Admitting: Cardiology

## 2020-02-22 ENCOUNTER — Encounter: Payer: Self-pay | Admitting: Cardiology

## 2020-02-22 ENCOUNTER — Other Ambulatory Visit: Payer: Self-pay

## 2020-02-22 VITALS — BP 140/82 | HR 94 | Resp 17 | Ht 69.0 in | Wt 202.0 lb

## 2020-02-22 DIAGNOSIS — E785 Hyperlipidemia, unspecified: Secondary | ICD-10-CM

## 2020-02-22 DIAGNOSIS — I10 Essential (primary) hypertension: Secondary | ICD-10-CM

## 2020-02-22 DIAGNOSIS — Z87891 Personal history of nicotine dependence: Secondary | ICD-10-CM

## 2020-02-22 DIAGNOSIS — H548 Legal blindness, as defined in USA: Secondary | ICD-10-CM

## 2020-02-22 DIAGNOSIS — R079 Chest pain, unspecified: Secondary | ICD-10-CM

## 2020-02-22 DIAGNOSIS — R072 Precordial pain: Secondary | ICD-10-CM

## 2020-02-22 DIAGNOSIS — F1090 Alcohol use, unspecified, uncomplicated: Secondary | ICD-10-CM

## 2020-02-22 MED ORDER — METOPROLOL TARTRATE 25 MG PO TABS: 25.0000 mg | ORAL_TABLET | Freq: Two times a day (BID) | ORAL | 3 refills | Status: DC

## 2020-02-22 NOTE — Progress Notes (Signed)
Date:  02/22/2020   ID:  Benjamin Morales, DOB 24-Mar-1961, MRN [id number]  PCP:  Lin Landsman, MD  Cardiologist:  Rex Kras, DO, [facility] (established care 02/22/2020) Former Cardiology Providers: Dr. Dorris Carnes.   REASON FOR CONSULT: Chest Pain.   REQUESTING PHYSICIAN:  Lin Landsman, [address],  [place] [address]  Chief Complaint  Patient presents with  . New Patient (Initial Visit)    Chest Pain    HPI  Benjamin Morales is a 59 y.o. male who presents to the office with a chief complaint of " chest pain." He is referred to the office at the request of Lin Landsman, MD. Patient's past medical history and cardiovascular risk factors include: Benign essential hypertension, dyslipidemia, former smoker, chronic excessive alcohol use, legally blind.  Patient is referred to the office for evaluation of chest pain.  Patient states that recently he went to [facility] for evaluation of chest pain on February 07, 2020.  Patient states that he was at work and after having breakfast he had an episode of vomiting which precipitated chest discomfort.  Because the pain was not relieved EMS was called and patient was given sublingual nitroglycerin tablets and aspirin.  Patient states that he had some relief with sublingual nitroglycerin tablet once he received morphine his symptoms resolved.   At the hospital he was evaluated by [facility] heart care Dr. Dorris Carnes and the tentative plan was to proceed with coronary CTA.  However, patient left AMA.  He now presents to the office for reevaluation.  Patient states that the pain is located over the left anterior chest wall around his left nipple line, intensity is 2 out of 10, constant, lasting throughout the day for 24 hours in duration, more prominent with a deep breath.  Pain is not brought on by effort related activities nor is it relieved with rest.   Patient states that he has a history of pleurisy while he was a child and the discomfort is very  similar in nature.  However because of his cardiovascular risk factors he is here for further evaluation.  Denies prior history of coronary artery disease, myocardial infarction, congestive heart failure, deep venous thrombosis, pulmonary embolism, stroke, transient ischemic attack.  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 No structured exercise program or daily routine.    ALLERGIES: Allergies  Allergen Reactions  . Thorazine [Chlorpromazine] Anaphylaxis    MEDICATION LIST PRIOR TO VISIT: Current Meds  Medication Sig  . albuterol (VENTOLIN HFA) 108 (90 Base) MCG/ACT inhaler Inhale 1 puff into the lungs 2 (two) times daily as needed for wheezing.   Marland Kitchen alprazolam (XANAX) 2 MG tablet Take 2 mg by mouth 3 (three) times daily as needed for anxiety.   Marland Kitchen amLODipine (NORVASC) 10 MG tablet Take 10 mg by mouth daily.   Marland Kitchen aspirin EC 81 MG tablet Take 1 tablet (81 mg total) by mouth daily.  Marland Kitchen atorvastatin (LIPITOR) 40 MG tablet Take 1 tablet (40 mg total) by mouth daily.  . caffeine 200 MG TABS tablet Take 200 mg by mouth every 4 (four) hours as needed.  . fluticasone (FLONASE) 50 MCG/ACT nasal spray Place 1 spray into both nostrils at bedtime as needed for allergies.  Marland Kitchen losartan (COZAAR) 100 MG tablet Take 100 mg by mouth daily.   . Multiple Vitamin (MULTIVITAMIN WITH MINERALS) TABS tablet Take 2 tablets by mouth daily.  Marland Kitchen omeprazole (PRILOSEC)  20 MG capsule Take 20 mg by mouth daily.   Marland Kitchen oxyCODONE-acetaminophen (PERCOCET) 10-325 MG per tablet Take 1-2 tablets by mouth every 6 (six) hours as needed for pain.   Marland Kitchen QUEtiapine (SEROQUEL) 300 MG tablet Take 600 mg by mouth at bedtime.  . vitamin B-12 (CYANOCOBALAMIN) 100 MCG tablet Take 100 mcg by mouth daily.  Marland Kitchen VITAMIN D PO Take 10,000 Units by mouth daily.     PAST MEDICAL HISTORY: Past Medical History:  Diagnosis Date  . HLD (hyperlipidemia)   . Hypertension   . Legally blind   . Low  back pain     PAST SURGICAL HISTORY: Past Surgical History:  Procedure Laterality Date  . EYE SURGERY      FAMILY HISTORY: The patient family history includes Emphysema in his paternal uncle; Heart disease in his father; Stroke in his father.  SOCIAL HISTORY:  The patient  reports that he quit smoking about 6 years ago. His smoking use included cigarettes. He has a 30.00 pack-year smoking history. He has never used smokeless tobacco. He reports current alcohol use of about 28.0 standard drinks of alcohol per week. He reports that he does not use drugs.  REVIEW OF SYSTEMS: Review of Systems  Constitutional: Negative for chills and fever.  HENT: Negative for hoarse voice and nosebleeds.        Legally blind.   Eyes: Negative for discharge, double vision and pain.  Cardiovascular: Positive for chest pain (pluretic). Negative for claudication, dyspnea on exertion, leg swelling, near-syncope, orthopnea, palpitations, paroxysmal nocturnal dyspnea and syncope.  Respiratory: Negative for hemoptysis and shortness of breath.   Musculoskeletal: Negative for muscle cramps and myalgias.  Gastrointestinal: Negative for abdominal pain, constipation, diarrhea, hematemesis, hematochezia, melena, nausea and vomiting.  Neurological: Negative for dizziness and light-headedness.    PHYSICAL EXAM: Vitals with BMI 02/22/2020 02/08/2020 02/08/2020  Height 5\' 9"  - -  Weight 202 lbs - -  BMI 94.70 - -  Systolic [phone]  Diastolic 82 75 95  Pulse 94 67 79   CONSTITUTIONAL: Well-developed and well-nourished. No acute distress.  SKIN: Skin is warm and dry. No rash noted. No cyanosis. No pallor. No jaundice HEAD: Normocephalic and atraumatic.  EYES: No scleral icterus MOUTH/THROAT: Moist oral membranes.  NECK: No JVD present. No thyromegaly noted. No carotid bruits  LYMPHATIC: No visible cervical adenopathy.  CHEST Normal respiratory effort. No intercostal retractions  LUNGS: Clear to auscultation  bilaterally.  No stridor. No wheezes. No rales.  CARDIOVASCULAR: Regular rate and rhythm, positive S1-S2, no murmurs rubs or gallops appreciated. ABDOMINAL: No apparent ascites.  EXTREMITIES: No peripheral edema  HEMATOLOGIC: No significant bruising NEUROLOGIC: Oriented to person, place, and time. Nonfocal. Normal muscle tone.  PSYCHIATRIC: Normal mood and affect. Normal behavior. Cooperative  CARDIAC DATABASE: EKG: 02/22/2020:Sinus  Rhythm, 90bpm, normal axis, without underlying ishemia or injury pattern.  Echocardiogram: 08/22/2015: LVEF 55 to 60%, no regional wall motion abnormalities, normal diastolic function.  Stress Testing: None  Heart Catheterization: None  LABORATORY DATA: CBC Latest Ref Rng & Units 02/08/2020 02/07/2020 02/07/2020  WBC 4.0 - 10.5 K/uL 8.1 10.6(H) 9.4  Hemoglobin 13.0 - 17.0 g/dL 12.7(L) 13.5 12.8(L)  Hematocrit 39 - 52 % 39.0 40.7 38.5(L)  Platelets 150 - 400 K/uL 292 326 293    CMP Latest Ref Rng & Units 02/08/2020 02/07/2020 02/07/2020  Glucose 70 - 99 mg/dL 135(H) - 110(H)  BUN 6 - 20 mg/dL 15 - 12  Creatinine 0.61 - 1.24 mg/dL 1.37(H) 1.31(H)  1.28(H)  Sodium 135 - 145 mmol/L 138 - 138  Potassium 3.5 - 5.1 mmol/L 3.1(L) - 3.3(L)  Chloride 98 - 111 mmol/L 99 - 100  CO2 22 - 32 mmol/L 26 - 24  Calcium 8.9 - 10.3 mg/dL 9.3 - 9.5  Total Protein 6.5 - 8.1 g/dL - 8.3(H) 7.9  Total Bilirubin 0.3 - 1.2 mg/dL - 0.7 0.5  Alkaline Phos 38 - 126 U/L - 53 49  AST 15 - 41 U/L - 29 35  ALT 0 - 44 U/L - 48(H) 47(H)    Lipid Panel     Component Value Date/Time   CHOL 172 02/08/2020 0450   TRIG 355 (H) 02/08/2020 0450   HDL 34 (L) 02/08/2020 0450   CHOLHDL 5.1 02/08/2020 0450   VLDL 71 (H) 02/08/2020 0450   LDLCALC 67 02/08/2020 0450    No components found for: NTPROBNP No results for input(s): PROBNP in the last 8760 hours. Recent Labs    02/07/20 1700  TSH 1.328    BMP Recent Labs    02/07/20 0906 02/07/20 1700 02/08/20 0450  NA 138  --   138  K 3.3*  --  3.1*  CL 100  --  99  CO2 24  --  26  GLUCOSE 110*  --  135*  BUN 12  --  15  CREATININE 1.28* 1.31* 1.37*  CALCIUM 9.5  --  9.3  GFRNONAA >60 59* 56*  GFRAA >60 >60 >60    HEMOGLOBIN A1C Lab Results  Component Value Date   HGBA1C 6.5 (H) 02/07/2020   MPG 139.85 02/07/2020    IMPRESSION:    ICD-10-CM   1. Precordial pain  R07.2 EKG 12-Lead    CT CORONARY FRACTIONAL FLOW RESERVE DATA PREP    CT CORONARY FRACTIONAL FLOW RESERVE FLUID ANALYSIS    PCV ECHOCARDIOGRAM COMPLETE    metoprolol tartrate (LOPRESSOR) 25 MG tablet  2. Benign hypertension  I10   3. Dyslipidemia  E78.5   4. Former smoker  Z87.891   5. Legally blind  H54.8   6. Chronic alcohol use  Z72.89   7. Chest pain, unspecified type  R07.9 CT CORONARY MORPH W/CTA COR W/SCORE W/CA W/CM &/OR WO/CM     RECOMMENDATIONS: Benjamin Morales is a 59 y.o. male whose past medical history and cardiac risk factors include: Benign essential hypertension, dyslipidemia, former smoker, chronic excessive alcohol use, legally blind.  Chest pain:  Patient symptoms of chest discomfort appear to be atypical in nature.  Echocardiogram will be ordered to evaluate for structural heart disease and left ventricular systolic function.  We will proceed with coronary CTA as originally scheduled to evaluate for coronary artery disease and coronary artery calcification scoring.  We will start metoprolol 25 mg p.o. twice daily.  His other cardiovascular risk factors such as hypertension and hyperlipidemia are currently managed by primary team.  Benign essential hypertension:  Currently managed by primary team.  Educated on importance of a low-salt diet.  He is asked to call the office or his PCP if his systolic blood pressures are consistently higher than 140 mmHg.  Dyslipidemia: . Continue statin therapy.   . Follow lipids. . Currently managed by primary care provider. . Patient denies myalgia or other side  effects. . Most recent lipid profile reviewed with the patient. Most recent AST and ALT values reviewed with the patient  Former smoker: Educated on the importance of continued smoking cessation.   Also informed him that his triglyceride levels are elevated  and not at goal.  Patient states that the last lipid profile was done nonfasting.  He is asked to follow-up with his PCP regarding this.  FINAL MEDICATION LIST END OF ENCOUNTER: Meds ordered this encounter  Medications  . metoprolol tartrate (LOPRESSOR) 25 MG tablet    Sig: Take 1 tablet (25 mg total) by mouth 2 (two) times daily.    Dispense:  180 tablet    Refill:  3    Medications Discontinued During This Encounter  Medication Reason  . Promethazine HCl 6.25 MG/5ML SOLN Patient Preference     Current Outpatient Medications:  .  albuterol (VENTOLIN HFA) 108 (90 Base) MCG/ACT inhaler, Inhale 1 puff into the lungs 2 (two) times daily as needed for wheezing. , Disp: , Rfl:  .  alprazolam (XANAX) 2 MG tablet, Take 2 mg by mouth 3 (three) times daily as needed for anxiety. , Disp: , Rfl: 0 .  amLODipine (NORVASC) 10 MG tablet, Take 10 mg by mouth daily. , Disp: , Rfl:  .  aspirin EC 81 MG tablet, Take 1 tablet (81 mg total) by mouth daily., Disp: 30 tablet, Rfl: 0 .  atorvastatin (LIPITOR) 40 MG tablet, Take 1 tablet (40 mg total) by mouth daily., Disp: 30 tablet, Rfl: 6 .  caffeine 200 MG TABS tablet, Take 200 mg by mouth every 4 (four) hours as needed., Disp: , Rfl:  .  fluticasone (FLONASE) 50 MCG/ACT nasal spray, Place 1 spray into both nostrils at bedtime as needed for allergies., Disp: , Rfl:  .  losartan (COZAAR) 100 MG tablet, Take 100 mg by mouth daily. , Disp: , Rfl:  .  Multiple Vitamin (MULTIVITAMIN WITH MINERALS) TABS tablet, Take 2 tablets by mouth daily., Disp: , Rfl:  .  omeprazole (PRILOSEC) 20 MG capsule, Take 20 mg by mouth daily. , Disp: , Rfl:  .  oxyCODONE-acetaminophen (PERCOCET) 10-325 MG per tablet, Take 1-2  tablets by mouth every 6 (six) hours as needed for pain. , Disp: , Rfl: 0 .  QUEtiapine (SEROQUEL) 300 MG tablet, Take 600 mg by mouth at bedtime., Disp: , Rfl:  .  vitamin B-12 (CYANOCOBALAMIN) 100 MCG tablet, Take 100 mcg by mouth daily., Disp: , Rfl:  .  VITAMIN D PO, Take 10,000 Units by mouth daily., Disp: , Rfl:  .  hydrochlorothiazide (HYDRODIURIL) 25 MG tablet, Take 1 tablet by mouth daily., Disp: , Rfl:  .  metoprolol tartrate (LOPRESSOR) 25 MG tablet, Take 1 tablet (25 mg total) by mouth 2 (two) times daily., Disp: 180 tablet, Rfl: 3 .  nitroGLYCERIN (NITROSTAT) 0.4 MG SL tablet, Place 1 tablet (0.4 mg total) under the tongue every 5 (five) minutes x 3 doses as needed for chest pain., Disp: 25 tablet, Rfl: 3  Orders Placed This Encounter  Procedures  . CT CORONARY MORPH W/CTA COR W/SCORE W/CA W/CM &/OR WO/CM  . CT CORONARY FRACTIONAL FLOW RESERVE DATA PREP  . CT CORONARY FRACTIONAL FLOW RESERVE FLUID ANALYSIS  . EKG 12-Lead  . PCV ECHOCARDIOGRAM COMPLETE    There are no Patient Instructions on file for this visit.   --Continue cardiac medications as reconciled in final medication list. --Return in about 4 weeks (around 03/21/2020) for review test results., re-evaluation of symptoms.. Or sooner if needed. --Continue follow-up with your primary care physician regarding the management of your other chronic comorbid conditions.  Patient's questions and concerns were addressed to his satisfaction. He voices understanding of the instructions provided during this encounter.   This  note was created using a voice recognition software as a result there may be grammatical errors inadvertently enclosed that do not reflect the nature of this encounter. Every attempt is made to correct such errors.  Rex Kras, [place], [facility]  Pager: [phone] Office: [phone]

## 2020-02-29 ENCOUNTER — Other Ambulatory Visit: Payer: Medicare HMO

## 2020-03-06 ENCOUNTER — Ambulatory Visit: Payer: Medicare HMO

## 2020-03-06 ENCOUNTER — Other Ambulatory Visit: Payer: Self-pay

## 2020-03-06 DIAGNOSIS — R072 Precordial pain: Secondary | ICD-10-CM

## 2020-03-18 NOTE — Progress Notes (Signed)
Called and spoke with patient regarding His echocardiogram results.

## 2020-03-24 ENCOUNTER — Other Ambulatory Visit: Payer: Self-pay | Admitting: Cardiology

## 2020-03-24 ENCOUNTER — Telehealth (HOSPITAL_COMMUNITY): Payer: Self-pay | Admitting: *Deleted

## 2020-03-24 DIAGNOSIS — R072 Precordial pain: Secondary | ICD-10-CM

## 2020-03-24 NOTE — Telephone Encounter (Signed)
Reaching out to patient to offer assistance regarding upcoming cardiac imaging study; pt verbalizes understanding of appt date/time, parking situation and where to check in, pre-test NPO status and medications ordered, and verified current allergies; name and call back number provided for further questions should they arise  [facility] [phone] office [phone] cell  Pt verbalized understanding that he will need to get blood work prior to testing.

## 2020-03-26 ENCOUNTER — Other Ambulatory Visit: Payer: Self-pay | Admitting: Cardiology

## 2020-03-26 ENCOUNTER — Encounter (HOSPITAL_COMMUNITY): Payer: Self-pay

## 2020-03-26 ENCOUNTER — Other Ambulatory Visit: Payer: Self-pay

## 2020-03-26 ENCOUNTER — Ambulatory Visit (HOSPITAL_COMMUNITY)
Admission: RE | Admit: 2020-03-26 | Discharge: 2020-03-26 | Disposition: A | Discharge status: Home / Self Care | Payer: Medicare HMO | Source: Ambulatory Visit | Attending: Cardiology | Admitting: Cardiology

## 2020-03-26 ENCOUNTER — Telehealth: Payer: Self-pay

## 2020-03-26 DIAGNOSIS — R072 Precordial pain: Secondary | ICD-10-CM

## 2020-03-26 DIAGNOSIS — I10 Essential (primary) hypertension: Secondary | ICD-10-CM

## 2020-03-26 DIAGNOSIS — R079 Chest pain, unspecified: Secondary | ICD-10-CM | POA: Insufficient documentation

## 2020-03-26 LAB — BASIC METABOLIC PANEL
BUN/Creatinine Ratio: 16 (ref 10–24)
BUN: 25 mg/dL (ref 8–27)
CO2: 26 mmol/L (ref 20–29)
Calcium: 10.1 mg/dL (ref 8.6–10.2)
Chloride: 98 mmol/L (ref 96–106)
Creatinine, Ser: 1.56 mg/dL — ABNORMAL HIGH (ref 0.76–1.27)
GFR calc Af Amer: 55 mL/min/{1.73_m2} — ABNORMAL LOW (ref >59)
GFR calc non Af Amer: 48 mL/min/{1.73_m2} — ABNORMAL LOW (ref >59)
Glucose: 97 mg/dL (ref 65–99)
Potassium: 3.9 mmol/L (ref 3.5–5.2)
Sodium: 139 mmol/L (ref 134–144)

## 2020-03-26 MED ORDER — IOHEXOL 350 MG/ML SOLN
80.0000 mL | Freq: Once | INTRAVENOUS | Status: AC | PRN
  Administered 2020-03-26: 80 mL via INTRAVENOUS

## 2020-03-26 MED ORDER — NITROGLYCERIN 0.4 MG SL SUBL
0.8000 mg | SUBLINGUAL_TABLET | Freq: Once | SUBLINGUAL | Status: AC
  Administered 2020-03-26: 0.8 mg via SUBLINGUAL

## 2020-03-26 MED ORDER — METOPROLOL TARTRATE 5 MG/5ML IV SOLN: 5.0000 mg | INTRAVENOUS | Status: DC | PRN

## 2020-03-26 MED ORDER — NITROGLYCERIN 0.4 MG SL SUBL
SUBLINGUAL_TABLET | SUBLINGUAL | Status: AC
  Filled 2020-03-26: qty 2

## 2020-03-26 NOTE — Progress Notes (Signed)
Please have him hold his losartan for now.  Increase water intake up 3-4 glasses per day.  Repeat BMP on Monday 03/31/2020 Based on kidney function will restart Losartan.  If he has BP greater than 15mmHg consistently have him call the office.

## 2020-03-26 NOTE — Telephone Encounter (Signed)
Called pt to inform him about is lab results and to repeat his BMP and to stop the losartan. Informed pt to call the office if his bp is over 140 constant. Pt understood.

## 2020-03-26 NOTE — Telephone Encounter (Signed)
-----   Message from [place], [place] sent at 03/26/2020  1:05 PM EDT ----- Please have him hold his losartan for now.  Increase water intake up 3-4 glasses per day.  Repeat BMP on Monday 03/31/2020 Based on kidney function will restart Losartan.  If he has BP greater than 133mmHg consistently have him call the office.

## 2020-03-29 LAB — BASIC METABOLIC PANEL
BUN/Creatinine Ratio: 13 (ref 10–24)
BUN: 17 mg/dL (ref 8–27)
CO2: 23 mmol/L (ref 20–29)
Calcium: 9.1 mg/dL (ref 8.6–10.2)
Chloride: 100 mmol/L (ref 96–106)
Creatinine, Ser: 1.31 mg/dL — ABNORMAL HIGH (ref 0.76–1.27)
GFR calc Af Amer: 68 mL/min/{1.73_m2} (ref >59)
GFR calc non Af Amer: 59 mL/min/{1.73_m2} — ABNORMAL LOW (ref >59)
Glucose: 116 mg/dL — ABNORMAL HIGH (ref 65–99)
Potassium: 3.7 mmol/L (ref 3.5–5.2)
Sodium: 140 mmol/L (ref 134–144)

## 2020-03-31 ENCOUNTER — Telehealth: Payer: Self-pay

## 2020-03-31 ENCOUNTER — Other Ambulatory Visit: Payer: Self-pay

## 2020-03-31 DIAGNOSIS — I10 Essential (primary) hypertension: Secondary | ICD-10-CM

## 2020-03-31 NOTE — Telephone Encounter (Signed)
-----   Message from [place], [place] sent at 03/30/2020  8:06 PM EDT ----- Kidney function appears to be relatively back to baseline.Have him restart the losartan and encouraged him to be hydrated.  Would recommend rechecking labs in 1 week for follow-up.

## 2020-03-31 NOTE — Telephone Encounter (Signed)
Sure next week is fine.

## 2020-03-31 NOTE — Telephone Encounter (Signed)
Called pt to inform him about his lab results and to restart his  Losartan and to repeat blood work in one week.  Pt has an appt tomorrow would you  Like him to reschedule for next week.

## 2020-04-01 ENCOUNTER — Ambulatory Visit: Payer: Medicare HMO | Admitting: Cardiology

## 2020-04-02 NOTE — Progress Notes (Signed)
Spoke with patient regarding CT scan results.

## 2020-04-15 NOTE — Progress Notes (Signed)
Called and spoke with patient regarding scheduling an appointment to go over test results for Dr. Terri Skains.

## 2020-04-17 ENCOUNTER — Ambulatory Visit: Payer: Medicare HMO | Admitting: Cardiology

## 2020-04-29 ENCOUNTER — Ambulatory Visit: Payer: Medicare HMO | Admitting: Cardiology

## 2020-05-15 ENCOUNTER — Ambulatory Visit: Payer: Medicare HMO | Admitting: Cardiology

## 2020-05-15 ENCOUNTER — Encounter: Payer: Self-pay | Admitting: Cardiology

## 2020-05-15 ENCOUNTER — Other Ambulatory Visit: Payer: Self-pay

## 2020-05-15 VITALS — BP 149/85 | HR 84 | Resp 16 | Ht 69.0 in | Wt 198.0 lb

## 2020-05-15 DIAGNOSIS — I10 Essential (primary) hypertension: Secondary | ICD-10-CM

## 2020-05-15 DIAGNOSIS — I2510 Atherosclerotic heart disease of native coronary artery without angina pectoris: Secondary | ICD-10-CM

## 2020-05-15 DIAGNOSIS — Z87891 Personal history of nicotine dependence: Secondary | ICD-10-CM

## 2020-05-15 DIAGNOSIS — E785 Hyperlipidemia, unspecified: Secondary | ICD-10-CM

## 2020-05-15 DIAGNOSIS — I2584 Coronary atherosclerosis due to calcified coronary lesion: Secondary | ICD-10-CM

## 2020-05-15 DIAGNOSIS — H548 Legal blindness, as defined in USA: Secondary | ICD-10-CM

## 2020-05-15 DIAGNOSIS — F1090 Alcohol use, unspecified, uncomplicated: Secondary | ICD-10-CM

## 2020-05-15 NOTE — Progress Notes (Signed)
Date:  05/15/2020   ID:  Benjamin Morales, DOB 08-01-1961, MRN [id number]  PCP:  Lin Landsman, MD  Cardiologist:  Rex Kras, DO, [facility] (established care 02/22/2020) Former Cardiology Providers: Dr. Dorris Carnes.   Date: 05/15/20 Last Office Visit: 02/22/2020  Chief Complaint  Patient presents with  . Follow-up  . Results    echo and CCTA   HPI  Benjamin Morales is a 59 y.o. male who presents to the office with a chief complaint of " reevaluation of chest pain and review test results." Patient's past medical history and cardiovascular risk factors include: Coronary artery calcification, nonobstructive CAD, benign essential hypertension, dyslipidemia, former smoker, chronic excessive alcohol use, legally blind.  Patient is referred to the office for evaluation of chest pain.    At the last office visit patient was here for evaluation of chest discomfort and he has had multiple ER visits in the past for similar symptoms.  Patient left [place] prior to getting his coronary CTA.  Due to continued symptoms, multiple ER visits, the shared decision was to proceed with coronary CTA to evaluate for obstructive CAD.  Patient did undergo coronary CTA was noted to have moderate coronary artery calcification score.  And nonobstructive CAD which was nonsignificant per CT FFR.   Since the coronary CTA patient states that his chest pain has essentially resolved.  He still continues to have pleuritic chest discomfort but overall intensity has improved significantly.  Noncardiac findings noted right lower lobe nodule 4 mm he is asked to follow-up with his PCP regarding the management of this incidental finding.  Denies prior history of myocardial infarction, congestive heart failure, deep venous thrombosis, pulmonary embolism, stroke, transient ischemic attack.  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 No structured  exercise program or daily routine.    ALLERGIES: Allergies  Allergen Reactions  . Thorazine [Chlorpromazine] Anaphylaxis    MEDICATION LIST PRIOR TO VISIT: Current Meds  Medication Sig  . albuterol (VENTOLIN HFA) 108 (90 Base) MCG/ACT inhaler Inhale 1 puff into the lungs 2 (two) times daily as needed for wheezing.   Marland Kitchen alprazolam (XANAX) 2 MG tablet Take 2 mg by mouth 3 (three) times daily as needed for anxiety.   Marland Kitchen amLODipine (NORVASC) 10 MG tablet Take 10 mg by mouth daily.   Marland Kitchen aspirin EC 81 MG tablet Take 1 tablet (81 mg total) by mouth daily.  Marland Kitchen atorvastatin (LIPITOR) 40 MG tablet Take 1 tablet (40 mg total) by mouth daily.  . caffeine 200 MG TABS tablet Take 200 mg by mouth every 4 (four) hours as needed.  . fluticasone (FLONASE) 50 MCG/ACT nasal spray Place 1 spray into both nostrils at bedtime as needed for allergies.  . hydrochlorothiazide (HYDRODIURIL) 25 MG tablet Take 1 tablet by mouth daily.  Marland Kitchen losartan (COZAAR) 100 MG tablet Take 100 mg by mouth daily.   . metoprolol tartrate (LOPRESSOR) 25 MG tablet Take 1 tablet (25 mg total) by mouth 2 (two) times daily.  . Multiple Vitamin (MULTIVITAMIN WITH MINERALS) TABS tablet Take 2 tablets by mouth daily.  . nitroGLYCERIN (NITROSTAT) 0.4 MG SL tablet Place 1 tablet (0.4 mg total) under the tongue every 5 (five) minutes x 3 doses as needed for chest pain.  Marland Kitchen omeprazole (PRILOSEC) 20 MG capsule Take 20 mg by mouth daily.   Marland Kitchen oxyCODONE-acetaminophen (PERCOCET) 10-325 MG per tablet Take 1-2 tablets by mouth every 6 (six) hours  as needed for pain.   Marland Kitchen QUEtiapine (SEROQUEL) 300 MG tablet Take 600 mg by mouth at bedtime.  . vitamin B-12 (CYANOCOBALAMIN) 100 MCG tablet Take 100 mcg by mouth daily.  Marland Kitchen VITAMIN D PO Take 10,000 Units by mouth daily.     PAST MEDICAL HISTORY: Past Medical History:  Diagnosis Date  . HLD (hyperlipidemia)   . Hypertension   . Legally blind   . Low back pain     PAST SURGICAL HISTORY: Past Surgical  History:  Procedure Laterality Date  . EYE SURGERY      FAMILY HISTORY: The patient family history includes Emphysema in his paternal uncle; Heart disease in his father; Stroke in his father.  SOCIAL HISTORY:  The patient  reports that he quit smoking about 6 years ago. His smoking use included cigarettes. He has a 30.00 pack-year smoking history. He has never used smokeless tobacco. He reports current alcohol use of about 28.0 standard drinks of alcohol per week. He reports that he does not use drugs.  REVIEW OF SYSTEMS: Review of Systems  Constitutional: Negative for chills and fever.  HENT: Negative for hoarse voice and nosebleeds.        Legally blind.   Eyes: Negative for discharge, double vision and pain.  Cardiovascular: Positive for chest pain (pluretic, but imporved compared to before. ). Negative for claudication, dyspnea on exertion, leg swelling, near-syncope, orthopnea, palpitations, paroxysmal nocturnal dyspnea and syncope.  Respiratory: Negative for hemoptysis and shortness of breath.   Musculoskeletal: Negative for muscle cramps and myalgias.  Gastrointestinal: Negative for abdominal pain, constipation, diarrhea, hematemesis, hematochezia, melena, nausea and vomiting.  Neurological: Negative for dizziness and light-headedness.    PHYSICAL EXAM: Vitals with BMI 05/15/2020 03/26/2020 03/26/2020  Height 5\' 9"  - -  Weight 198 lbs - -  BMI 41.32 - -  Systolic 440 102 725  Diastolic 85 74 84  Pulse 84 - -   CONSTITUTIONAL: Well-developed and well-nourished. No acute distress.  SKIN: Skin is warm and dry. No rash noted. No cyanosis. No pallor. No jaundice HEAD: Normocephalic and atraumatic.  EYES: No scleral icterus MOUTH/THROAT: Moist oral membranes.  NECK: No JVD present. No thyromegaly noted. No carotid bruits  LYMPHATIC: No visible cervical adenopathy.  CHEST Normal respiratory effort. No intercostal retractions  LUNGS: Clear to auscultation bilaterally.  No  stridor. No wheezes. No rales.  CARDIOVASCULAR: Regular rate and rhythm, positive S1-S2, no murmurs rubs or gallops appreciated. ABDOMINAL: No apparent ascites.  EXTREMITIES: No peripheral edema  HEMATOLOGIC: No significant bruising NEUROLOGIC: Oriented to person, place, and time. Nonfocal. Normal muscle tone.  PSYCHIATRIC: Normal mood and affect. Normal behavior. Cooperative  CARDIAC DATABASE: EKG: 02/22/2020:Sinus  Rhythm, 90bpm, normal axis, without underlying ishemia or injury pattern.  Echocardiogram: 03/06/2020:  Normal LV systolic function with visual EF 55-60%. Left ventricle cavity is normal in size. Moderate left ventricular hypertrophy. Normal global wall motion. Normal diastolic filling pattern, normal LAP.  Mild tricuspid regurgitation. No evidence of pulmonary hypertension.  No other significant valvular abnormalities.  IVC is dilated with a respiratory response of >50%.  No significant change compared to prior study on 08/22/2015.  Stress Testing: None  Heart Catheterization: None  Coronary CTA 03/26/2020: 1. Coronary calcium score is 139, which places the patient in the 88th percentile for age and sex matched control. 2. Mid ascending aorta measures 35 mm. 3. Pulmonary artery measures 31 mm. 4. CAD-RADS = 3. Nonobstructive CAD in the left main, LAD, and LCX. Moderate stenosis in the  RCA. 5. Study sent for CT-FFR.  6. CT FFR analysis showed no significant stenosis. 7. Subpleural right lower lobe nodule measures 4 mm.   LABORATORY DATA: CBC Latest Ref Rng & Units 02/08/2020 02/07/2020 02/07/2020  WBC 4.0 - 10.5 K/uL 8.1 10.6(H) 9.4  Hemoglobin 13.0 - 17.0 g/dL 12.7(L) 13.5 12.8(L)  Hematocrit 39 - 52 % 39.0 40.7 38.5(L)  Platelets 150 - 400 K/uL 292 326 293    CMP Latest Ref Rng & Units 03/28/2020 03/25/2020 02/08/2020  Glucose 65 - 99 mg/dL 116(H) 97 135(H)  BUN 8 - 27 mg/dL 17 25 15   Creatinine 0.76 - 1.27 mg/dL 1.31(H) 1.56(H) 1.37(H)  Sodium 134 - 144 mmol/L  140 139 138  Potassium 3.5 - 5.2 mmol/L 3.7 3.9 3.1(L)  Chloride 96 - 106 mmol/L 100 98 99  CO2 20 - 29 mmol/L 23 26 26   Calcium 8.6 - 10.2 mg/dL 9.1 10.1 9.3  Total Protein 6.5 - 8.1 g/dL - - -  Total Bilirubin 0.3 - 1.2 mg/dL - - -  Alkaline Phos 38 - 126 U/L - - -  AST 15 - 41 U/L - - -  ALT 0 - 44 U/L - - -    Lipid Panel     Component Value Date/Time   CHOL 172 02/08/2020 0450   TRIG 355 (H) 02/08/2020 0450   HDL 34 (L) 02/08/2020 0450   CHOLHDL 5.1 02/08/2020 0450   VLDL 71 (H) 02/08/2020 0450   LDLCALC 67 02/08/2020 0450    No components found for: NTPROBNP No results for input(s): PROBNP in the last 8760 hours. Recent Labs    02/07/20 1700  TSH 1.328    BMP Recent Labs    02/08/20 0450 03/25/20 1543 03/28/20 1613  NA 138 139 140  K 3.1* 3.9 3.7  CL 99 98 100  CO2 26 26 23   GLUCOSE 135* 97 116*  BUN 15 25 17   CREATININE 1.37* 1.56* 1.31*  CALCIUM 9.3 10.1 9.1  GFRNONAA 56* 48* 59*  GFRAA >60 55* 68    HEMOGLOBIN A1C Lab Results  Component Value Date   HGBA1C 6.5 (H) 02/07/2020   MPG 139.85 02/07/2020    IMPRESSION:    ICD-10-CM   1. Coronary atherosclerosis due to calcified coronary lesion of native artery  I25.10    I25.84   2. Coronary artery calcification seen on computed tomography  I25.10   3. Benign hypertension  I10   4. Dyslipidemia  E78.5   5. Former smoker  Z87.891   39. Legally blind  H54.8   7. Chronic alcohol use  Z72.89      RECOMMENDATIONS: Adis Sturgill is a 59 y.o. male whose past medical history and cardiac risk factors include: Benign essential hypertension, dyslipidemia, former smoker, chronic excessive alcohol use, legally blind.  Coronary artery calcification: Continue aspirin and statin therapy.  Nonobstructive coronary artery disease as per coronary CTA:  Continue current medical management.  Recommend the importance of improving his modifiable cardiovascular risk factors.  I have asked him to discuss  uptitrating antihypertensive medications if clinically warranted with his PCP.  Encouraged increasing physical activity to 30 minutes a day 5 days a week.  Educated on importance of a low-salt diet.  Continue to monitor symptoms.  Educated on repeating his lipid profile given the hypertriglyceridemia.  Patient states that he will follow-up with his PCP.  Benign essential hypertension:  Office blood pressure is currently not at goal.  Patient is asked to keep a log of  his blood pressures and to review it with his PCP.  If clinically warranted would recommend uptitrating antihypertensive medications with a goal blood pressure between 120-130 mmHg.    Patient states that he will follow-up with his PCP.  Dyslipidemia: . Continue statin therapy.   Follow lipids. Currently managed by primary care provider. . Patient denies myalgia or other side effects.  Former smoker: Educated on the importance of continued smoking cessation.   Reviewed the results of the echocardiogram and coronary CTA with the patient at today's office visit.  FINAL MEDICATION LIST END OF ENCOUNTER: No orders of the defined types were placed in this encounter.   There are no discontinued medications.   Current Outpatient Medications:  .  albuterol (VENTOLIN HFA) 108 (90 Base) MCG/ACT inhaler, Inhale 1 puff into the lungs 2 (two) times daily as needed for wheezing. , Disp: , Rfl:  .  alprazolam (XANAX) 2 MG tablet, Take 2 mg by mouth 3 (three) times daily as needed for anxiety. , Disp: , Rfl: 0 .  amLODipine (NORVASC) 10 MG tablet, Take 10 mg by mouth daily. , Disp: , Rfl:  .  aspirin EC 81 MG tablet, Take 1 tablet (81 mg total) by mouth daily., Disp: 30 tablet, Rfl: 0 .  atorvastatin (LIPITOR) 40 MG tablet, Take 1 tablet (40 mg total) by mouth daily., Disp: 30 tablet, Rfl: 6 .  caffeine 200 MG TABS tablet, Take 200 mg by mouth every 4 (four) hours as needed., Disp: , Rfl:  .  fluticasone (FLONASE) 50 MCG/ACT nasal  spray, Place 1 spray into both nostrils at bedtime as needed for allergies., Disp: , Rfl:  .  hydrochlorothiazide (HYDRODIURIL) 25 MG tablet, Take 1 tablet by mouth daily., Disp: , Rfl:  .  losartan (COZAAR) 100 MG tablet, Take 100 mg by mouth daily. , Disp: , Rfl:  .  metoprolol tartrate (LOPRESSOR) 25 MG tablet, Take 1 tablet (25 mg total) by mouth 2 (two) times daily., Disp: 180 tablet, Rfl: 3 .  Multiple Vitamin (MULTIVITAMIN WITH MINERALS) TABS tablet, Take 2 tablets by mouth daily., Disp: , Rfl:  .  nitroGLYCERIN (NITROSTAT) 0.4 MG SL tablet, Place 1 tablet (0.4 mg total) under the tongue every 5 (five) minutes x 3 doses as needed for chest pain., Disp: 25 tablet, Rfl: 3 .  omeprazole (PRILOSEC) 20 MG capsule, Take 20 mg by mouth daily. , Disp: , Rfl:  .  oxyCODONE-acetaminophen (PERCOCET) 10-325 MG per tablet, Take 1-2 tablets by mouth every 6 (six) hours as needed for pain. , Disp: , Rfl: 0 .  QUEtiapine (SEROQUEL) 300 MG tablet, Take 600 mg by mouth at bedtime., Disp: , Rfl:  .  vitamin B-12 (CYANOCOBALAMIN) 100 MCG tablet, Take 100 mcg by mouth daily., Disp: , Rfl:  .  VITAMIN D PO, Take 10,000 Units by mouth daily., Disp: , Rfl:  .  LINZESS 145 MCG CAPS capsule, as needed., Disp: , Rfl:   No orders of the defined types were placed in this encounter.   There are no Patient Instructions on file for this visit.   --Continue cardiac medications as reconciled in final medication list. --Return in about 6 months (around 11/12/2020) for Reevaluation of CAD. Or sooner if needed. --Continue follow-up with your primary care physician regarding the management of your other chronic comorbid conditions.  Patient's questions and concerns were addressed to his satisfaction. He voices understanding of the instructions provided during this encounter.   This note was created using a voice  recognition software as a result there may be grammatical errors inadvertently enclosed that do not reflect the  nature of this encounter. Every attempt is made to correct such errors.  Rex Kras, [place], [facility]  Pager: [phone] Office: [phone]

## 2020-06-23 ENCOUNTER — Other Ambulatory Visit: Payer: Self-pay | Admitting: Family Medicine

## 2020-06-23 DIAGNOSIS — R946 Abnormal results of thyroid function studies: Secondary | ICD-10-CM

## 2020-06-23 DIAGNOSIS — R911 Solitary pulmonary nodule: Secondary | ICD-10-CM

## 2020-07-14 ENCOUNTER — Other Ambulatory Visit: Payer: Medicare HMO

## 2020-09-22 ENCOUNTER — Inpatient Hospital Stay: Admission: RE | Admit: 2020-09-22 | Payer: Medicare HMO | Source: Ambulatory Visit

## 2020-09-22 ENCOUNTER — Other Ambulatory Visit: Payer: Medicare HMO

## 2020-10-13 ENCOUNTER — Ambulatory Visit
Admission: RE | Admit: 2020-10-13 | Discharge: 2020-10-13 | Disposition: A | Discharge status: Home / Self Care | Payer: Medicare HMO | Source: Ambulatory Visit | Attending: Family Medicine | Admitting: Family Medicine

## 2020-10-13 ENCOUNTER — Inpatient Hospital Stay: Admission: RE | Admit: 2020-10-13 | Payer: Medicare HMO | Source: Ambulatory Visit

## 2020-10-13 ENCOUNTER — Other Ambulatory Visit: Payer: Self-pay | Admitting: Family Medicine

## 2020-10-13 DIAGNOSIS — R946 Abnormal results of thyroid function studies: Secondary | ICD-10-CM

## 2020-10-13 DIAGNOSIS — R911 Solitary pulmonary nodule: Secondary | ICD-10-CM

## 2020-10-13 MED ORDER — IOPAMIDOL (ISOVUE-300) INJECTION 61%
75.0000 mL | Freq: Once | INTRAVENOUS | Status: AC | PRN
  Administered 2020-10-13: 75 mL via INTRAVENOUS

## 2020-11-02 ENCOUNTER — Emergency Department (HOSPITAL_COMMUNITY): Payer: Medicare HMO

## 2020-11-02 ENCOUNTER — Encounter (HOSPITAL_COMMUNITY): Payer: Self-pay

## 2020-11-02 ENCOUNTER — Emergency Department (HOSPITAL_COMMUNITY)
Admission: EM | Admit: 2020-11-02 | Discharge: 2020-11-02 | Disposition: A | Discharge status: Home / Self Care | Payer: Medicare HMO | Attending: Emergency Medicine | Admitting: Emergency Medicine

## 2020-11-02 DIAGNOSIS — N451 Epididymitis: Secondary | ICD-10-CM | POA: Insufficient documentation

## 2020-11-02 DIAGNOSIS — I10 Essential (primary) hypertension: Secondary | ICD-10-CM | POA: Diagnosis not present

## 2020-11-02 DIAGNOSIS — Z7982 Long term (current) use of aspirin: Secondary | ICD-10-CM | POA: Diagnosis not present

## 2020-11-02 DIAGNOSIS — N50819 Testicular pain, unspecified: Secondary | ICD-10-CM | POA: Diagnosis present

## 2020-11-02 DIAGNOSIS — Z79899 Other long term (current) drug therapy: Secondary | ICD-10-CM | POA: Insufficient documentation

## 2020-11-02 DIAGNOSIS — Z87891 Personal history of nicotine dependence: Secondary | ICD-10-CM | POA: Diagnosis not present

## 2020-11-02 LAB — URINALYSIS, ROUTINE W REFLEX MICROSCOPIC
Bacteria, UA: NONE SEEN
Bilirubin Urine: NEGATIVE
Glucose, UA: NEGATIVE mg/dL
Ketones, ur: NEGATIVE mg/dL
Nitrite: NEGATIVE
Protein, ur: NEGATIVE mg/dL
Specific Gravity, Urine: 1.008 (ref 1.005–1.030)
WBC, UA: >50 WBC/hpf — ABNORMAL HIGH (ref 0–5)
pH: 7 (ref 5.0–8.0)

## 2020-11-02 LAB — CBC WITH DIFFERENTIAL/PLATELET
Abs Immature Granulocytes: 0.04 K/uL (ref 0.00–0.07)
Basophils Absolute: 0 K/uL (ref 0.0–0.1)
Basophils Relative: 0 %
Eosinophils Absolute: 0 K/uL (ref 0.0–0.5)
Eosinophils Relative: 0 %
HCT: 39.8 % (ref 39.0–52.0)
Hemoglobin: 13.3 g/dL (ref 13.0–17.0)
Immature Granulocytes: 0 %
Lymphocytes Relative: 12 %
Lymphs Abs: 1.6 K/uL (ref 0.7–4.0)
MCH: 28.8 pg (ref 26.0–34.0)
MCHC: 33.4 g/dL (ref 30.0–36.0)
MCV: 86.1 fL (ref 80.0–100.0)
Monocytes Absolute: 1.3 K/uL — ABNORMAL HIGH (ref 0.1–1.0)
Monocytes Relative: 9 %
Neutro Abs: 10.7 K/uL — ABNORMAL HIGH (ref 1.7–7.7)
Neutrophils Relative %: 79 %
Platelets: 366 K/uL (ref 150–400)
RBC: 4.62 MIL/uL (ref 4.22–5.81)
RDW: 12.6 % (ref 11.5–15.5)
WBC: 13.6 K/uL — ABNORMAL HIGH (ref 4.0–10.5)
nRBC: 0 % (ref 0.0–0.2)

## 2020-11-02 LAB — COMPREHENSIVE METABOLIC PANEL WITH GFR
ALT: 29 U/L (ref 0–44)
AST: 25 U/L (ref 15–41)
Albumin: 4.7 g/dL (ref 3.5–5.0)
Alkaline Phosphatase: 62 U/L (ref 38–126)
Anion gap: 14 (ref 5–15)
BUN: 8 mg/dL (ref 8–23)
CO2: 28 mmol/L (ref 22–32)
Calcium: 9.4 mg/dL (ref 8.9–10.3)
Chloride: 97 mmol/L — ABNORMAL LOW (ref 98–111)
Creatinine, Ser: 1.06 mg/dL (ref 0.61–1.24)
GFR, Estimated: >60 mL/min
Glucose, Bld: 90 mg/dL (ref 70–99)
Potassium: 3.2 mmol/L — ABNORMAL LOW (ref 3.5–5.1)
Sodium: 139 mmol/L (ref 135–145)
Total Bilirubin: 0.8 mg/dL (ref 0.3–1.2)
Total Protein: 8.4 g/dL — ABNORMAL HIGH (ref 6.5–8.1)

## 2020-11-02 LAB — HIV ANTIBODY (ROUTINE TESTING W REFLEX): HIV Screen 4th Generation wRfx: NONREACTIVE

## 2020-11-02 MED ORDER — HYDROMORPHONE HCL 1 MG/ML IJ SOLN
1.0000 mg | Freq: Once | INTRAMUSCULAR | Status: AC
  Administered 2020-11-02: 1 mg via INTRAVENOUS
  Filled 2020-11-02: qty 1

## 2020-11-02 MED ORDER — DOXYCYCLINE HYCLATE 100 MG PO CAPS: 100.0000 mg | ORAL_CAPSULE | Freq: Two times a day (BID) | ORAL | 0 refills | Status: DC

## 2020-11-02 MED ORDER — CEFTRIAXONE SODIUM 1 G IJ SOLR: 500.0000 mg | Freq: Once | INTRAMUSCULAR | Status: DC

## 2020-11-02 MED ORDER — POTASSIUM CHLORIDE CRYS ER 20 MEQ PO TBCR
40.0000 meq | EXTENDED_RELEASE_TABLET | Freq: Once | ORAL | Status: AC
  Administered 2020-11-02: 40 meq via ORAL
  Filled 2020-11-02: qty 2

## 2020-11-02 MED ORDER — SODIUM CHLORIDE 0.9 % IV SOLN
1.0000 g | Freq: Once | INTRAVENOUS | Status: AC
  Administered 2020-11-02: 1 g via INTRAVENOUS
  Filled 2020-11-02: qty 10

## 2020-11-02 MED ORDER — TAMSULOSIN HCL 0.4 MG PO CAPS: 0.4000 mg | ORAL_CAPSULE | Freq: Every day | ORAL | 0 refills | Status: DC

## 2020-11-02 NOTE — ED Provider Notes (Addendum)
I personally evaluated the patient during the encounter and completed a history, physical, procedures, medical decision making to contribute to the overall care of the patient and decision making for the patient briefly, the patient is a 60 y.o. male is here with left testicular swelling. Pain ongoing for about 4 5 days. Has swollen left testicle. Ultrasound showed left epididymitis with a complex hydrocele. No evidence of orchitis. No torsion. Patient given antibiotics, pain medication. Overall given information about epididymitis care. Will cover to cover for STDs given concern for STDs. Physical exam is not consistent with deeper space infection or Fournier's gangrene. Mild leukocytosis but otherwise normal labs. Normal kidney function.  This chart was dictated using voice recognition software.  Despite best efforts to proofread,  errors can occur which can change the documentation meaning.     EKG Interpretation None           Lennice Sites, DO 11/02/20 1334    Lennice Sites, DO 11/02/20 1335

## 2020-11-02 NOTE — ED Notes (Signed)
Pt cant urinate at this time will try again.

## 2020-11-02 NOTE — Discharge Instructions (Addendum)
Like we discussed, you have been diagnosed with epididymitis.  I have attached further information regarding this diagnosis for your reference.  I am concerned that you likely developed this secondary to a sexually transmitted infection.  You have been given IV antibiotics in the emergency department and you also need to take oral antibiotics.  I am prescribing you 10 days of doxycycline.  You need to take this twice a day.  Do not stop taking this early.  I am also prescribing you a medication called tamsulosin.  This is also known as Flomax.  Please take this once in the morning.  This will help increase your urinary frequency and hopefully help with your urinary retention.  Please follow-up with [facility].  Their phone number is below.  If you develop new or worsening symptoms, you can return to the emergency department for reevaluation.  It was a pleasure to meet you.

## 2020-11-02 NOTE — ED Notes (Signed)
Bladder scan > 999 ml, provider notified.

## 2020-11-02 NOTE — ED Triage Notes (Signed)
Pt reports pain in lower back starting 3-4 weeks ago, testicular pain started last week, swelling today. Left greater than right.

## 2020-11-02 NOTE — ED Triage Notes (Signed)
Pt BIB [facility] from home c/o lower back pain and swollen testicles starting this morning. Denies injury. No blood thinners.  BP 140/70 HR 92 RR 20 SpO2 98% RA

## 2020-11-02 NOTE — ED Provider Notes (Signed)
[facility] DEPT Provider Note   CSN: [id number] Arrival date & time: 11/02/20  0900     History Chief Complaint  Patient presents with  . Back Pain  . Groin Swelling    Benjamin Morales is a 60 y.o. male.  HPI Patient is a 60 year old male who has a history as noted below.  Patient states about 10 days ago he began experiencing testicular pain.  Over the past few days he reports worsening swelling.  States his symptoms worsen significantly with palpation and when ambulating.  He states his pain is so severe that it is radiating from his testicles to his low back as well as down his lower legs.  He states that he has been unable to walk any significant distance due to the pain for the last couple of days.  No history of similar symptoms.  No penile discharge, rashes, lesions, dysuria, hematuria.  Patient states that he has been sexually active with 2 male partners in the past 6 months.  He was most recently sexually active with a male partner about 4 to 8 weeks ago and states that he did not use protection at this time.  No history of receptive anal intercourse, per patient.  No other somatic complaints at this time.    Past Medical History:  Diagnosis Date  . HLD (hyperlipidemia)   . Hypertension   . Legally blind   . Low back pain     Patient Active Problem List   Diagnosis Date Noted  . Unstable angina ([place]) 02/07/2020  . Paresthesia 06/25/2016  . Chest pain of uncertain etiology 70/08/7492  . Insomnia 08/21/2015  . Hypertension 08/21/2015  . GERD (gastroesophageal reflux disease) 08/21/2015  . Renal mass, right 03/13/2015  . Other chest pain 03/11/2015    Past Surgical History:  Procedure Laterality Date  . EYE SURGERY         Family History  Problem Relation Age of Onset  . Emphysema Paternal Uncle        smoked  . Heart disease Father   . Stroke Father     Social History   Tobacco Use  . Smoking status: Former Smoker     Packs/day: 1.00    Years: 30.00    Pack years: 30.00    Types: Cigarettes    Quit date: 08/30/2013    Years since quitting: 7.1  . Smokeless tobacco: Never Used  Vaping Use  . Vaping Use: Every day  . Substances: Nicotine, Flavoring  Substance Use Topics  . Alcohol use: Yes    Alcohol/week: 28.0 standard drinks    Types: 28 Cans of beer per week    Comment: Occasional  . Drug use: No    Home Medications Prior to Admission medications   Medication Sig Start Date End Date Taking? Authorizing Provider  albuterol (VENTOLIN HFA) 108 (90 Base) MCG/ACT inhaler Inhale 1 puff into the lungs 2 (two) times daily as needed for wheezing.  12/01/19  Yes [provider]  ALPRAZolam Duanne Moron) 1 MG tablet Take 1 mg by mouth every morning. 10/16/20  Yes [provider]  amLODipine (NORVASC) 10 MG tablet Take 10 mg by mouth every morning. 02/02/15  Yes [provider]  aspirin EC 81 MG tablet Take 1 tablet (81 mg total) by mouth daily. 08/22/15  Yes Kelvin Cellar, MD  atorvastatin (LIPITOR) 20 MG tablet Take 20 mg by mouth every morning. 10/16/20  Yes [provider]  Cholecalciferol (VITAMIN D) 50 MCG (  2000 UT) tablet Take 2,000 Units by mouth every morning.   Yes [provider]  doxycycline (VIBRAMYCIN) 100 MG capsule Take 1 capsule (100 mg total) by mouth 2 (two) times daily. 11/02/20  Yes Rayna Sexton, PA-C  fluticasone (FLONASE) 50 MCG/ACT nasal spray Place 1 spray into both nostrils at bedtime as needed for allergies. 09/22/17  Yes [provider]  hydrochlorothiazide (HYDRODIURIL) 25 MG tablet Take 25 mg by mouth every morning. 02/11/20  Yes [provider]  losartan (COZAAR) 100 MG tablet Take 100 mg by mouth at bedtime. 02/02/15  Yes [provider]  metoprolol tartrate (LOPRESSOR) 25 MG tablet Take 1 tablet (25 mg total) by mouth 2 (two) times daily. 02/22/20 05/22/20 Yes Tolia, Sunit, DO  Multiple Vitamin (MULTIVITAMIN WITH  MINERALS) TABS tablet Take 2 tablets by mouth every morning.   Yes [provider]  naproxen sodium (ALEVE) 220 MG tablet Take 440 mg by mouth 2 (two) times daily as needed (pain).   Yes [provider]  nitroGLYCERIN (NITROSTAT) 0.4 MG SL tablet Place 1 tablet (0.4 mg total) under the tongue every 5 (five) minutes x 3 doses as needed for chest pain. 02/08/20  Yes Isaiah Serge, NP  omeprazole (PRILOSEC) 20 MG capsule Take 20 mg by mouth See admin instructions. Take one capsule (20 mg) by mouth every morning, take one capsule (20 mg) in the evening as needed for heartburn/acid reflux 01/26/15  Yes [provider]  oxyCODONE-acetaminophen (PERCOCET) 10-325 MG per tablet Take 1 tablet by mouth every 3 (three) hours as needed for pain. 02/25/15  Yes [provider]  QUEtiapine (SEROQUEL) 400 MG tablet Take 800-1,200 mg by mouth at bedtime. 08/22/20  Yes [provider]  tamsulosin (FLOMAX) 0.4 MG CAPS capsule Take 1 capsule (0.4 mg total) by mouth daily after breakfast. 11/02/20  Yes Rayna Sexton, PA-C  valACYclovir (VALTREX) 500 MG tablet Take 500 mg by mouth every morning. 09/17/20  Yes [provider]  vitamin B-12 (CYANOCOBALAMIN) 100 MCG tablet Take 100 mcg by mouth every morning.   Yes [provider]  atorvastatin (LIPITOR) 40 MG tablet Take 1 tablet (40 mg total) by mouth daily. Patient not taking: No sig reported 02/08/20   Isaiah Serge, NP    Allergies    Thorazine [chlorpromazine]  Review of Systems   Review of Systems  All other systems reviewed and are negative. Ten systems reviewed and are negative for acute change, except as noted in the HPI.    Physical Exam Updated Vital Signs BP (!) 165/94 (BP Location: Left Arm)   Pulse 78   Temp 98.2 F (36.8 C) (Oral)   Resp 20   Ht 5\' 9"  (1.753 m)   Wt 81.6 kg   SpO2 100%   BMI 26.58 kg/m   Physical Exam Vitals and nursing note reviewed.  Constitutional:       General: He is not in acute distress.    Appearance: Normal appearance. He is not ill-appearing, toxic-appearing or diaphoretic.     Comments: Well-developed adult male.  Writhing around on the stretcher.  Appears to be in pain.  HENT:     Head: Normocephalic and atraumatic.     Right Ear: External ear normal.     Left Ear: External ear normal.     Nose: Nose normal.     Mouth/Throat:     Mouth: Mucous membranes are moist.     Pharynx: Oropharynx is clear. No oropharyngeal exudate or posterior  oropharyngeal erythema.  Eyes:     General: No scleral icterus.       Right eye: No discharge.        Left eye: No discharge.     Extraocular Movements: Extraocular movements intact.     Conjunctiva/sclera: Conjunctivae normal.  Cardiovascular:     Rate and Rhythm: Normal rate and regular rhythm.     Pulses: Normal pulses.     Heart sounds: Normal heart sounds. No murmur heard. No friction rub. No gallop.   Pulmonary:     Effort: Pulmonary effort is normal. No respiratory distress.     Breath sounds: Normal breath sounds. No stridor. No wheezing, rhonchi or rales.  Abdominal:     General: Abdomen is flat.     Palpations: Abdomen is soft.     Tenderness: There is no abdominal tenderness.     Comments: Abdomen is flat, soft, and nontender.  Genitourinary:    Comments: Nursing chaperone present.  Normal-appearing uncircumcised penis.  Left sided testicular swelling.  No overlying erythema or skin changes.  Exquisite tenderness noted diffusely along the left testicle. Musculoskeletal:        General: Normal range of motion.     Cervical back: Normal range of motion and neck supple. No tenderness.  Skin:    General: Skin is warm and dry.  Neurological:     General: No focal deficit present.     Mental Status: He is alert and oriented to person, place, and time.  Psychiatric:        Mood and Affect: Mood normal.        Behavior: Behavior normal.    ED Results / Procedures / Treatments    Labs (all labs ordered are listed, but only abnormal results are displayed) Labs Reviewed  COMPREHENSIVE METABOLIC PANEL - Abnormal; Notable for the following components:      Result Value   Potassium 3.2 (*)    Chloride 97 (*)    Total Protein 8.4 (*)    All other components within normal limits  CBC WITH DIFFERENTIAL/PLATELET - Abnormal; Notable for the following components:   WBC 13.6 (*)    Neutro Abs 10.7 (*)    Monocytes Absolute 1.3 (*)    All other components within normal limits  URINALYSIS, ROUTINE W REFLEX MICROSCOPIC - Abnormal; Notable for the following components:   APPearance HAZY (*)    Hgb urine dipstick MODERATE (*)    Leukocytes,Ua LARGE (*)    WBC, UA >50 (*)    All other components within normal limits  URINE CULTURE  HIV ANTIBODY (ROUTINE TESTING W REFLEX)  GC/CHLAMYDIA PROBE AMP ([place]) NOT AT [facility]    EKG None  Radiology US SCROTUM W/DOPPLER  Result Date: 11/02/2020 CLINICAL DATA:  60 year old male with testicular pain and swelling, left greater than right. EXAM: SCROTAL ULTRASOUND DOPPLER ULTRASOUND OF THE TESTICLES TECHNIQUE: Complete ultrasound examination of the testicles, epididymis, and other scrotal structures was performed. Color and spectral Doppler ultrasound were also utilized to evaluate blood flow to the testicles. COMPARISON:  None. FINDINGS: Right testicle Measurements: 4.4 x 2.1 x 3.1 cm. No mass or microlithiasis visualized. Left testicle Measurements: 3.5 x 2.8 x 3.3 cm. No mass or microlithiasis visualized. Right epididymis:  Normal in size and appearance. Left epididymis: Enlarged, measuring up to 2.8 cm with associated hyperemia. Hydrocele: Complex left hydrocele with associated mild scrotal wall thickening. No right hydrocele. Varicocele:  None visualized. Pulsed Doppler interrogation of both testes demonstrates normal low  resistance arterial and venous waveforms bilaterally. IMPRESSION: Findings compatible with left epididymitis with  associated complex left hydrocele. There is mildly increased hyperemia in the left testicle, though no overt evidence of orchitis. Electronically Signed   By: Ruthann Cancer MD   On: 11/02/2020 10:05    Procedures Procedures   Medications Ordered in ED Medications  HYDROmorphone (DILAUDID) injection 1 mg (1 mg Intravenous Given 11/02/20 0945)  potassium chloride SA (KLOR-CON) CR tablet 40 mEq (40 mEq Oral Given 11/02/20 1113)  HYDROmorphone (DILAUDID) injection 1 mg (1 mg Intravenous Given 11/02/20 1115)  cefTRIAXone (ROCEPHIN) 1 g in sodium chloride 0.9 % 100 mL IVPB (0 g Intravenous Stopped 11/02/20 1144)   ED Course  I have reviewed the triage vital signs and the nursing notes.  Pertinent labs & imaging results that were available during my care of the patient were reviewed by me and considered in my medical decision making (see chart for details).  Clinical Course as of 11/02/20 1707  Nancy Fetter Nov 02, 2020  1039 US SCROTUM W/DOPPLER IMPRESSION: Findings compatible with left epididymitis with associated complex left hydrocele. There is mildly increased hyperemia in the left testicle, though no overt evidence of orchitis. [LJ]  8527 Patient reassessed.  Still notes a moderate amount of pain.  Will give an additional dose of Dilaudid.  Will treat empirically for gonorrhea/chlamydia with Rocephin.  We will also replete his potassium.  We will continue to monitor and likely discharge. [LJ]  1532 Patient having difficulty urinating.  In and out was attempted unsuccessfully.  Coud-tip then attempted by nursing staff unsuccessfully. [LJ]  7824 WBC, UA(!): >50 [LJ]  1700 Leukocytes,Ua(!): LARGE [LJ]    Clinical Course User Index [LJ] Rayna Sexton, PA-C   MDM Rules/Calculators/A&P                          Pt is a 60 y.o. male who presents the emergency department due to what appears to be epididymitis.  Recent history of unprotected sex.  Labs: CBC with a leukocytosis of 13.6, neutrophilia of  10.7, monocytes of 1.3. CMP with a potassium of 3.2, chloride of 97, total protein of 8.4.  Potassium repleted with Klor-Con. HIV antibody testing in process. UA showing moderate hemoglobin, large leukocytes, greater than 50 white blood cells.. Urine culture in process. GC/chlamydia probe in process.  Imaging: Ultrasound of the scrotum shows left epididymitis with associated complex left hydrocele. They also noticed some mildly increased hyperemia on the left testicle without overt evidence of orchitis.  I, Rayna Sexton, PA-C, personally reviewed and evaluated these images and lab results as part of my medical decision-making.  Patient given 2 doses of Dilaudid. Appears to have improved his pain.   Patient notes having Percocet at home for continued breakthrough pain. Patient given 1 g of Rocephin in the emergency department. Patient has a history of difficulty with urination since he was a child. States that he feels today is mildly worse since he began experiencing the scrotal swelling.  Bladder scan showing 228 cc.  Patient then given p.o. fluids and repeat bladder scan showing 1000 cc.  He then attempted to urinate successfully, voiding about 400 to 500 cc.    We will discharge patient on additional doxycycline.  Discussed the importance of this antibiotic and not stopping it early.  Given his urinary retention as well, will discharge on a course of Flomax.  Strongly urged patient to follow-up with urology first thing tomorrow.  Feel that he could benefit from further work-up with urology as well as reevaluation of his epididymitis.  Recommended he refrain from sexual intercourse for least 2 weeks.  He verbalized understanding the above plan.  His questions were answered and he was amicable at the time of discharge.  Note: Portions of this report may have been transcribed using voice recognition software. Every effort was made to ensure accuracy; however, inadvertent computerized transcription  errors may be present.   Final Clinical Impression(s) / ED Diagnoses Final diagnoses:  Epididymitis   Rx / DC Orders ED Discharge Orders         Ordered    doxycycline (VIBRAMYCIN) 100 MG capsule  2 times daily        11/02/20 1532    tamsulosin (FLOMAX) 0.4 MG CAPS capsule  Daily after breakfast        11/02/20 1643           Rayna Sexton, PA-C 11/02/20 [place], [place], DO 11/03/20 [phone]

## 2020-11-04 LAB — URINE CULTURE: Culture: NO GROWTH

## 2020-11-12 ENCOUNTER — Ambulatory Visit: Payer: Medicare HMO | Admitting: Cardiology

## 2020-11-20 ENCOUNTER — Ambulatory Visit: Payer: Medicare HMO | Admitting: Cardiology

## 2020-11-24 ENCOUNTER — Other Ambulatory Visit: Payer: Self-pay | Admitting: Cardiology

## 2021-05-12 ENCOUNTER — Ambulatory Visit (INDEPENDENT_AMBULATORY_CARE_PROVIDER_SITE_OTHER): Payer: Medicare HMO | Admitting: Pulmonary Disease

## 2021-05-12 ENCOUNTER — Encounter: Payer: Self-pay | Admitting: Pulmonary Disease

## 2021-05-12 ENCOUNTER — Other Ambulatory Visit: Payer: Self-pay

## 2021-05-12 VITALS — BP 110/70 | HR 75 | Ht 69.0 in | Wt 181.6 lb

## 2021-05-12 DIAGNOSIS — G4700 Insomnia, unspecified: Secondary | ICD-10-CM

## 2021-05-12 MED ORDER — ESZOPICLONE 2 MG PO TABS: 2.0000 mg | ORAL_TABLET | Freq: Every evening | ORAL | 3 refills | Status: DC | PRN

## 2021-05-12 NOTE — Patient Instructions (Signed)
Chronic insomnia  Prescription for Lunesta sent into pharmacy  I will see you in 3 months  Call with significant concerns

## 2021-05-12 NOTE — Progress Notes (Signed)
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    ALPRAZolam (XANAX) 1 MG tablet Take 1 mg by mouth every morning.   amLODipine (NORVASC) 10 MG tablet Take 10 mg by mouth every morning.   aspirin EC 81 MG tablet Take 1 tablet (81 mg total) by mouth daily.   atorvastatin (LIPITOR) 20 MG tablet Take 20 mg by mouth every morning.   Cholecalciferol (VITAMIN D) 50 MCG (2000 UT) tablet Take 2,000 Units by mouth every morning.   fluticasone (FLONASE) 50 MCG/ACT nasal spray Place 1 spray into both nostrils at bedtime as needed for allergies.   hydrochlorothiazide (HYDRODIURIL) 25 MG tablet Take 25 mg by mouth every morning.   losartan (COZAAR) 100 MG tablet Take 100 mg by mouth at bedtime.   Multiple Vitamin (MULTIVITAMIN WITH MINERALS) TABS tablet Take 2 tablets by mouth every morning.   naproxen sodium (ALEVE) 220 MG tablet Take 440 mg by mouth 2 (two) times daily as needed (pain).   nitroGLYCERIN (NITROSTAT) 0.4 MG SL tablet Place 1 tablet (0.4 mg total) under the tongue every 5 (five) minutes x 3 doses as needed for chest pain.   omeprazole (PRILOSEC) 20 MG capsule Take 20 mg by mouth See admin instructions. Take one capsule (20 mg) by mouth every morning, take one capsule (20 mg) in the evening as needed for heartburn/acid reflux    oxyCODONE-acetaminophen (PERCOCET) 10-325 MG per tablet Take 1 tablet by mouth every 3 (three) hours as needed for pain.   QUEtiapine (SEROQUEL) 400 MG tablet Take 800-1,200 mg by mouth at bedtime.   tamsulosin (FLOMAX) 0.4 MG CAPS capsule Take 1 capsule (0.4 mg total) by mouth daily after breakfast.   valACYclovir (VALTREX) 500 MG tablet Take 500 mg by mouth every morning.   vitamin B-12 (CYANOCOBALAMIN) 100 MCG tablet Take 100 mcg by mouth every morning.   metoprolol tartrate (LOPRESSOR) 25 MG tablet Take 1 tablet (25 mg total) by mouth 2 (two) times daily.   [DISCONTINUED] atorvastatin (LIPITOR) 40 MG tablet Take 1 tablet (40 mg total) by mouth daily. (Patient not taking: No sig reported)   [DISCONTINUED] doxycycline (VIBRAMYCIN) 100 MG capsule Take 1 capsule (100 mg total) by mouth 2 (two) times daily. (Patient not taking: Reported on 05/12/2021)   No facility-administered encounter medications on file as of 05/12/2021.    Allergies as of 05/12/2021 - Review Complete 05/12/2021  Allergen Reaction Noted   Thorazine [chlorpromazine] Anaphylaxis 08/21/2015    Past Medical History:  Diagnosis Date   HLD (hyperlipidemia)    Hypertension    Legally blind    Low back pain     Past Surgical History:  Procedure Laterality Date   EYE SURGERY      Family History  Problem Relation Age of Onset  Emphysema Paternal Uncle        smoked   Heart disease Father    Stroke Father     Social History   Socioeconomic History   Marital status: Single    Spouse name: Not on file   Number of children: 2   Years of education: Not on file   Highest education level: Not on file  Occupational History   Not on file  Tobacco Use   Smoking status: Former    Packs/day: 1.00    Years: 30.00    Pack years: 30.00    Types: Cigarettes    Quit date: 08/30/2013    Years since quitting: 7.7   Smokeless tobacco: Never  Vaping Use   Vaping Use: Every day   Substances: Nicotine, Flavoring  Substance  and Sexual Activity   Alcohol use: Yes    Alcohol/week: 28.0 standard drinks    Types: 28 Cans of beer per week    Comment: Occasional   Drug use: No   Sexual activity: Not Currently  Other Topics Concern   Not on file  Social History Narrative   Not on file   Social Determinants of Health   Financial Resource Strain: Not on file  Food Insecurity: Not on file  Transportation Needs: Not on file  Physical Activity: Not on file  Stress: Not on file  Social Connections: Not on file  Intimate Partner Violence: Not on file    Review of Systems  Constitutional:  Negative for fatigue.  Psychiatric/Behavioral:  Positive for sleep disturbance.    Vitals:   05/12/21 1532  BP: 110/70  Pulse: 75  SpO2: 100%     Physical Exam Constitutional:      Appearance: Normal appearance.  HENT:     Head: Normocephalic.     Nose: No congestion.     Mouth/Throat:     Mouth: Mucous membranes are moist.  Cardiovascular:     Rate and Rhythm: Normal rate and regular rhythm.     Heart sounds: No murmur heard.   No friction rub.  Pulmonary:     Effort: No respiratory distress.     Breath sounds: No stridor. No wheezing or rhonchi.  Neurological:     Mental Status: He is alert.     Cranial Nerves: No cranial nerve deficit.     Data Reviewed: CT scan of the chest 10/14/2020 with multiple nodules-report suggest being benign as it has been stable with follow-up.  Calcified mediastinal and hilar lymph nodes  Assessment:  Chronic insomnia  Hypertension  History of right renal mass  Plan/Recommendations: Trial with Lunesta 2 mg  Dose may be escalated as needed  Optimize sleep hygiene  Tentative follow-up in 3 months   Sherrilyn Rist MD [facility] 05/12/2021, 3:49 PM  CC: Lin Landsman, MD

## 2021-05-13 ENCOUNTER — Telehealth: Payer: Self-pay | Admitting: Pulmonary Disease

## 2021-05-13 NOTE — Telephone Encounter (Signed)
Medication name and strength: Lunesta '2mg'$  PA approved/denied: approved Approval dates: 08/31/20-08/29/21 If denied, reason for denial: approved  KEY: [id number]  I called to notify [facility] and claim was approved. ATC patient,left VM stating med was approved and waiting at pharmacy. Nothing further needed.

## 2021-06-11 IMAGING — US US THYROID
1 series · 14 of 25 positions shown · non-contrast
Comparison: None.

CLINICAL DATA: Other. 58-year-old male with abnormal thyroid
hormone metabolism.

EXAM:
THYROID ULTRASOUND
TECHNIQUE: Ultrasound examination of the thyroid gland and adjacent soft
tissues was performed.

[Series 1: us thyroid · 0.06mm/px · 14 of 42 slices shown]
[im 1/42]
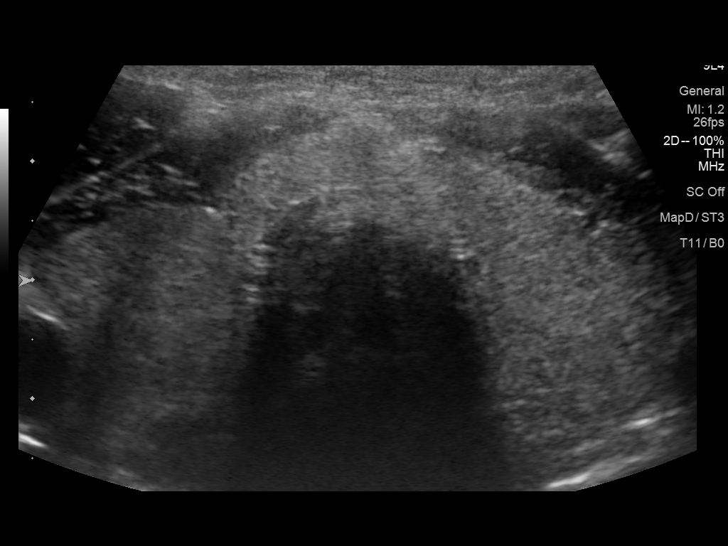
[im 4/42]
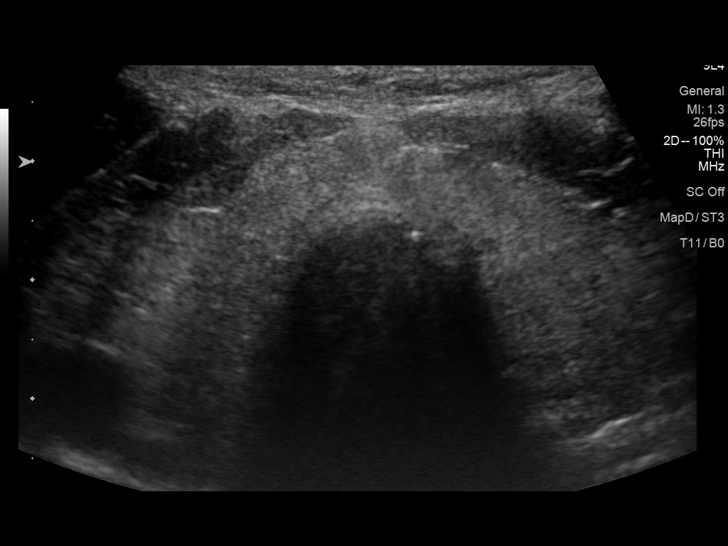
[im 7/42]
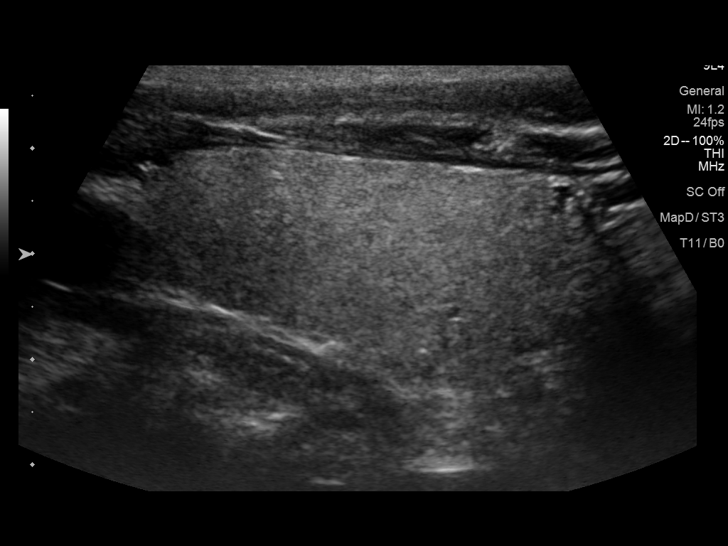
[im 11/42]
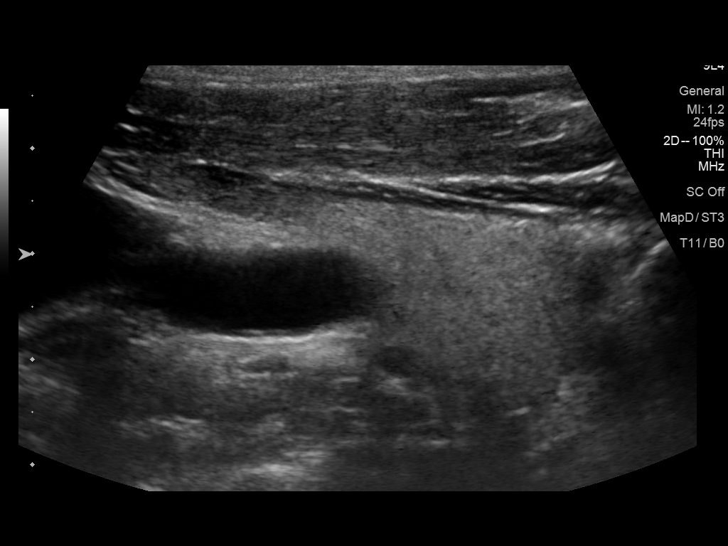
[im 14/42]
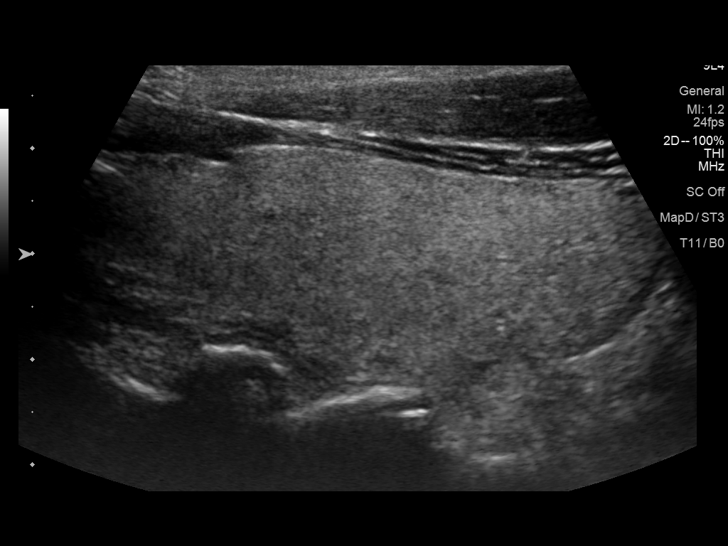
[im 16/42]
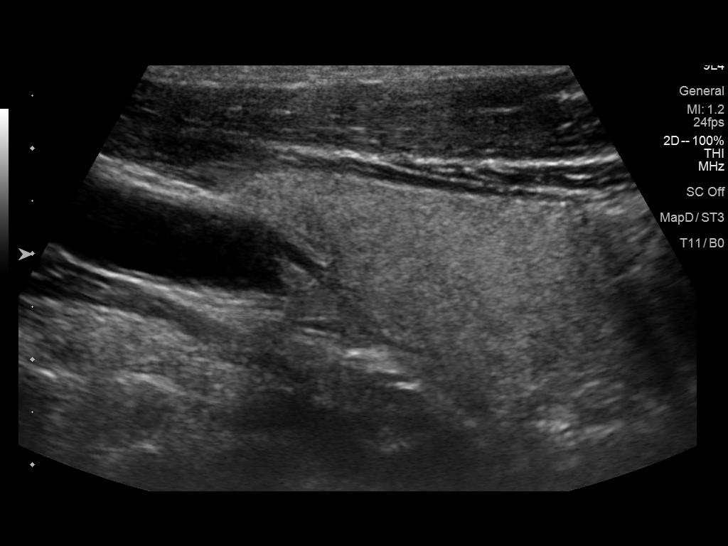
[im 19/42]
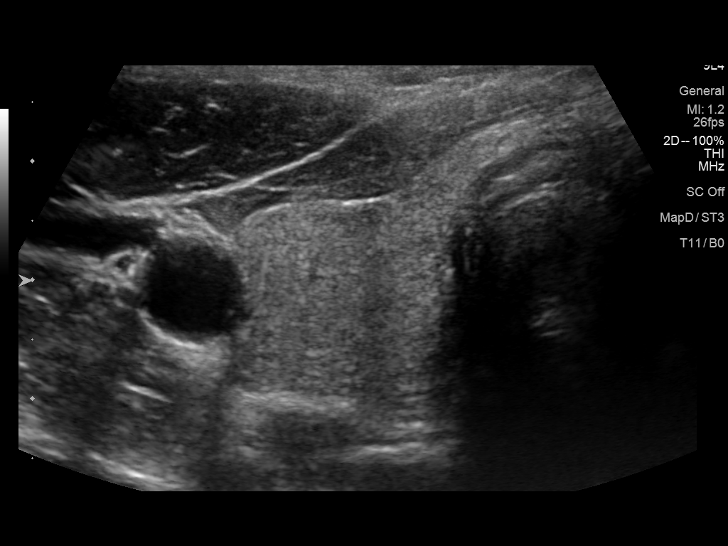
[im 23/42]
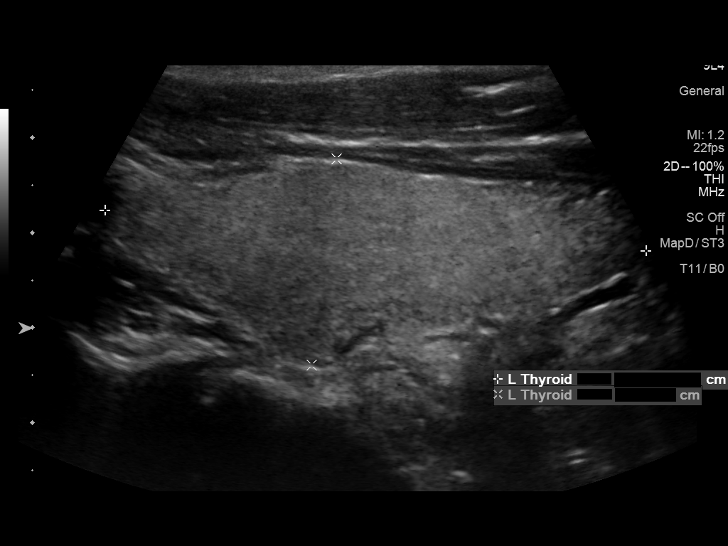
[im 26/42]
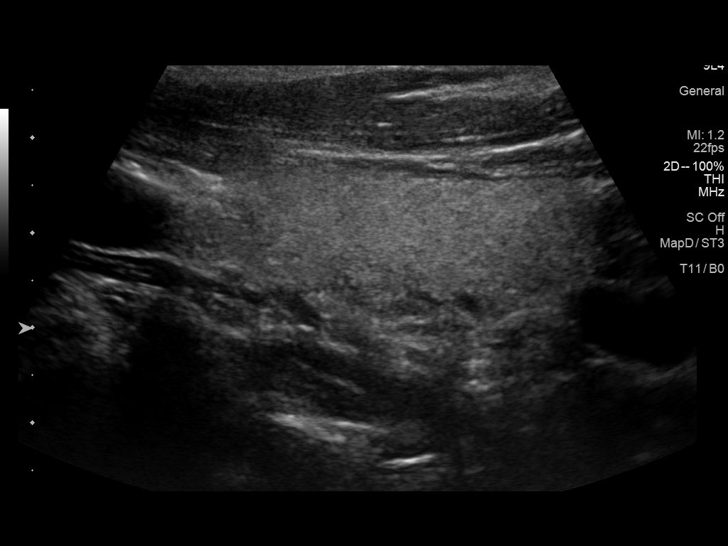
[im 28/42]
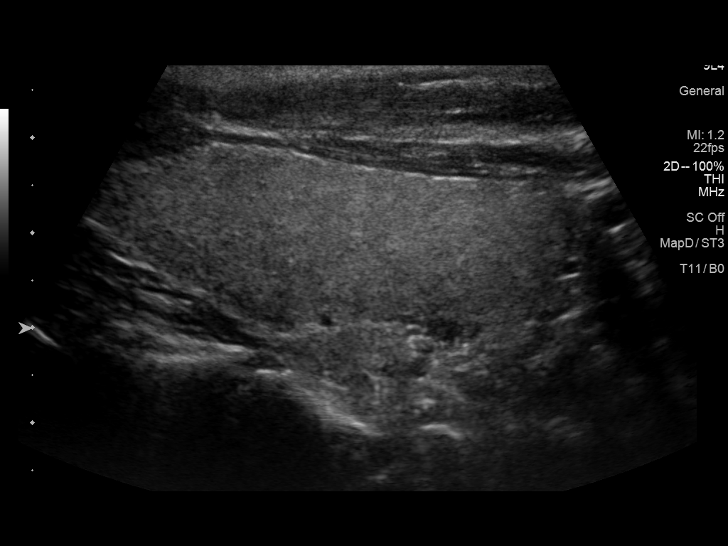
[im 31/42]
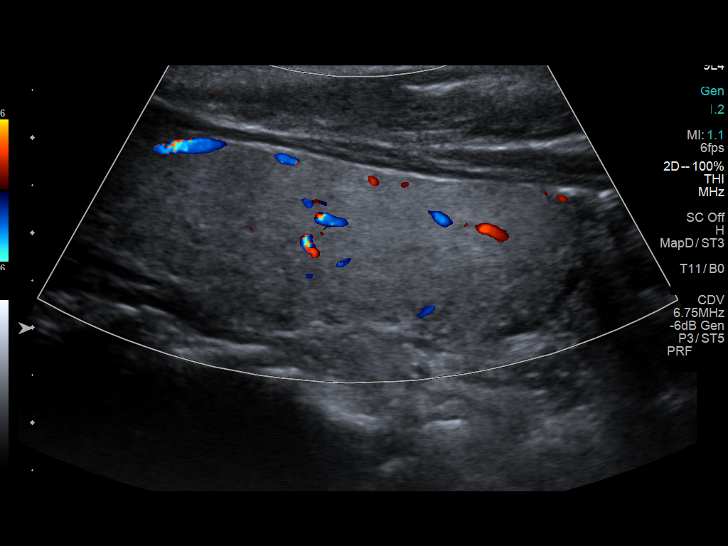
[im 35/42]
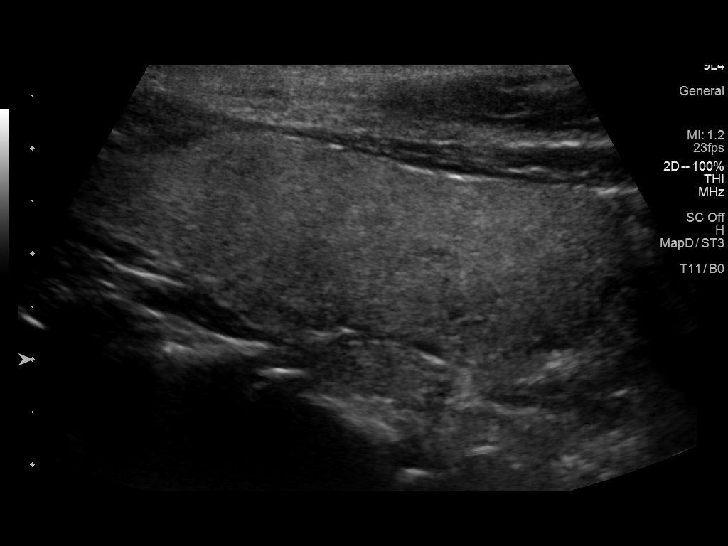
[im 38/42]
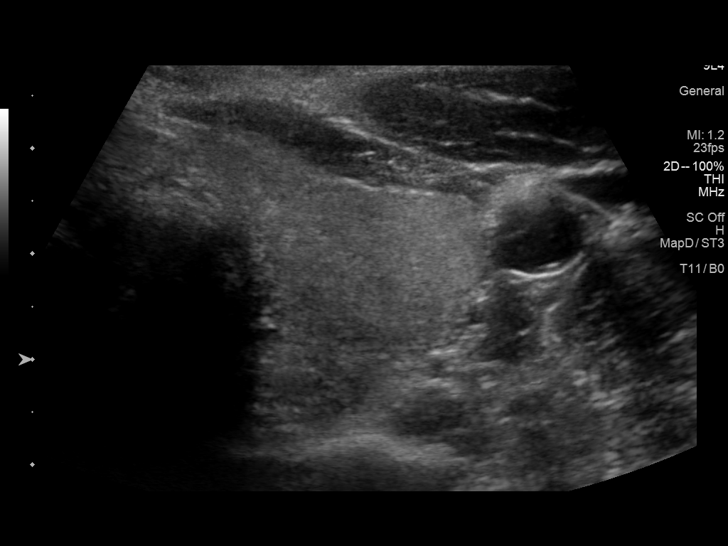
[im 42/42]
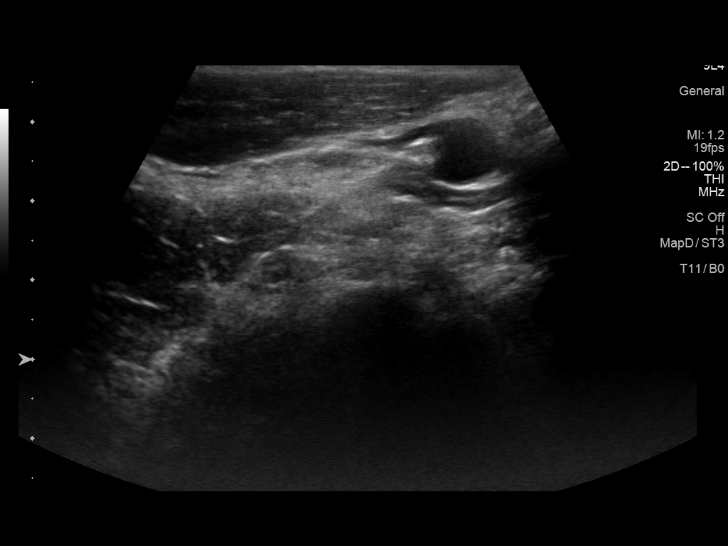

[14 of 25 positions shown; findings below may reference images not displayed]

FINDINGS: Parenchymal Echotexture: Normal

Isthmus: 0.4 cm

Right lobe: 4.7 x 2.1 x 1.8 cm

Left lobe: 5.7 x 2.2 x 2.3 cm

_________________________________________________________

Estimated total number of nodules >/= 1 cm: 0

Number of spongiform nodules >/=  2 cm not described below (TR1): 0

Number of mixed cystic and solid nodules >/= 1.5 cm not described
below (TR2): 0

_________________________________________________________

No discrete nodules are seen within the thyroid gland.
IMPRESSION: Normal sonographic appearance of the thyroid gland. No discrete
nodules.

## 2021-06-24 ENCOUNTER — Telehealth: Payer: Self-pay | Admitting: Pulmonary Disease

## 2021-06-24 NOTE — Telephone Encounter (Signed)
Called and spoke with pt who states the Benjamin Morales is not working for him. Pt said that he has even taken up to 3 tabs of the Lunesta and still is not sleeping. Pt wants to know what we can recommend. Dr. Jenetta Downer, please advise.

## 2021-06-25 ENCOUNTER — Other Ambulatory Visit: Payer: Self-pay | Admitting: Pulmonary Disease

## 2021-06-25 MED ORDER — DAYVIGO 10 MG PO TABS: 10.0000 mg | ORAL_TABLET | Freq: Every day | ORAL | 0 refills | Status: DC

## 2021-06-25 NOTE — Telephone Encounter (Signed)
Prescription for Dayvigo sent in  Stop [facility]

## 2021-06-25 NOTE — Telephone Encounter (Signed)
Left message for patient to call back about provider recommendations.

## 2021-06-25 NOTE — Progress Notes (Signed)
Prescription for [facility] sent in

## 2021-06-26 NOTE — Telephone Encounter (Signed)
I have called the pt and he is aware of AO recs.  Nothing further is needed.

## 2021-07-09 ENCOUNTER — Telehealth: Payer: Self-pay | Admitting: Pulmonary Disease

## 2021-07-09 NOTE — Telephone Encounter (Signed)
AO please advise on the change of medication.  thanks

## 2021-07-09 NOTE — Telephone Encounter (Signed)
We do not have any other options since he already told me in the office that all the agents that I can put him on did not work for him  -Ambien did not work -Trazodone did not work -Physiological scientist is not working  If he remembers anything that worked for him a bit in the past, he can let us know and I will be glad to prescribe it

## 2021-07-10 NOTE — Telephone Encounter (Signed)
I have called the pt and he stated that the only medication that has worked for him is the seroquel.  He stated that his pcp sent him over to our office as she did not want to write this for him.  She felt that this was too much medication.  He stated that he has to have the 400 mg  and he takes 3 tablets at bedtime.  He stated that if he only takes 2 he will be up at 1 am and not able to go back to sleep.  AO please advise. thanks

## 2021-07-14 NOTE — Telephone Encounter (Signed)
Seroquel is not FDA approved for treatment of insomnia  When used off label for insomnia, much lower doses are used.   I am not familiar with these doses of Seroquel being used for sleep There are too many side effects, a good number of which can be very serious with serious harm.   I apologize, I will not be able to help with this  We can facilitate evaluation by any of our other sleep providers to see if they may be able to help.

## 2021-07-14 NOTE — Telephone Encounter (Signed)
I have called the pt and LM on VM for him to call back

## 2021-07-16 NOTE — Telephone Encounter (Signed)
AO please advise who to refer the pt to. He is willing to try anything as he is not able to sleep.  thanks

## 2021-07-21 ENCOUNTER — Other Ambulatory Visit: Payer: Self-pay | Admitting: Pulmonary Disease

## 2021-07-21 MED ORDER — TEMAZEPAM 30 MG PO CAPS: 30.0000 mg | ORAL_CAPSULE | Freq: Every evening | ORAL | 0 refills | Status: DC | PRN

## 2021-07-21 NOTE — Telephone Encounter (Signed)
Prescription for Restoril sent to pharmacy in place of Lunesta  We can have him see either Dr. Halford Chessman, Elsworth Soho or Annamaria Boots if patient desires

## 2021-07-21 NOTE — Progress Notes (Signed)
Prescription for Restoril sent to pharmacy in place of [place]

## 2021-07-21 NOTE — Telephone Encounter (Signed)
Still waiting on response from Dr. O. Please advise. 

## 2021-07-22 NOTE — Telephone Encounter (Signed)
Call made to patient, confirmed DOB. Made aware of AO recommendations. Voiced understanding. Appt made.   Nothing further needed at this time.

## 2021-07-27 ENCOUNTER — Telehealth: Payer: Self-pay | Admitting: Pulmonary Disease

## 2021-07-27 NOTE — Telephone Encounter (Signed)
Called [facility] but stayed on hold for over 10 minutes without anyone coming to the phone. Will attempt to call back later today.

## 2021-07-27 NOTE — Telephone Encounter (Signed)
Ruben from [employer]. States patient taking other depression medication. Checking to see if patient needs Restoril. Ruben phone number is [phone].

## 2021-07-28 NOTE — Telephone Encounter (Signed)
Spoke with pharmacist and the want to make sure Dr. Ander Slade is aware that pt is currently taking Xanax 1mg  TID. Dr. Ander Slade do want the pharmacy to  fill temazepam? Please advise.

## 2021-07-28 NOTE — Telephone Encounter (Signed)
Message has been taken care of.   Nothing further needed at this time.

## 2021-08-13 ENCOUNTER — Ambulatory Visit: Payer: Medicare HMO | Admitting: Pulmonary Disease

## 2021-09-02 ENCOUNTER — Encounter: Payer: Self-pay | Admitting: Pulmonary Disease

## 2021-09-02 ENCOUNTER — Other Ambulatory Visit: Payer: Self-pay

## 2021-09-02 ENCOUNTER — Ambulatory Visit (INDEPENDENT_AMBULATORY_CARE_PROVIDER_SITE_OTHER): Payer: Medicare HMO | Admitting: Pulmonary Disease

## 2021-09-02 VITALS — BP 136/82 | HR 62 | Temp 98.1°F | Ht 68.0 in | Wt 180.0 lb

## 2021-09-02 DIAGNOSIS — G4724 Circadian rhythm sleep disorder, free running type: Secondary | ICD-10-CM

## 2021-09-02 DIAGNOSIS — F5104 Psychophysiologic insomnia: Secondary | ICD-10-CM

## 2021-09-02 MED ORDER — ESZOPICLONE 3 MG PO TABS
3.0000 mg | ORAL_TABLET | Freq: Every day | ORAL | 1 refills | Status: DC
Start: 2021-09-02 — End: 2021-09-09

## 2021-09-02 MED ORDER — QUETIAPINE FUMARATE 400 MG PO TABS: 400.0000 mg | ORAL_TABLET | Freq: Every day | ORAL | 1 refills | Status: DC

## 2021-09-02 NOTE — Progress Notes (Signed)
Subjective:    Patient ID: Benjamin Morales, male    DOB: 12/06/1960, 61 y.o.   MRN: [id number]  HPI  61 year old blind Man referred for evaluation of chronic insomnia. He reports onset around 2005 when he was out on the job and developed back pain and required chronic pain medications, he could not sleep and and later on this took-on a life of its own He is tried multiple agents in the past  Ambien did not work Trazodone did not work  Seroquel did help -he was titrated over the years from 100 mg to her current dose of 1200 mg .  PCP was concerned about high dose of Seroquel and stopped prescribing He has seen my partner Dr. Gala Murdoch  in sep 2022  -prescription for Lunesta 2 mg was given but not effective .  Temazepam was contraindicated since he is already on benzodiazepines  PMH -chronic back pain, on oxycodone -Chronic anxiety on Xanax  Usual bedtime around 10 PM and wakes up around 5 AM, he goes into work from 8 AM to 4 PM and works in [employer].  When he is unable to sleep he is occasionally taking naps in the daytime .  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  Past Medical History:  Diagnosis Date   HLD (hyperlipidemia)    Hypertension    Legally blind    Low back pain     .psh  Allergies  Allergen Reactions   Thorazine [Chlorpromazine] Anaphylaxis    Social History   Socioeconomic History   Marital status: Single    Spouse name: Not on file   Number of children: 2   Years of education: Not on file   Highest education level: Not on file  Occupational History   Not on file  Tobacco Use   Smoking status: Every Day    Types: E-cigarettes   Smokeless tobacco: Never  Vaping Use   Vaping Use: Every day   Substances: Nicotine, Flavoring  Substance and Sexual Activity   Alcohol use: Yes    Alcohol/week: 28.0 standard drinks    Types: 28  Cans of beer per week    Comment: Occasional   Drug use: No   Sexual activity: Not Currently  Other Topics Concern   Not on file  Social History Narrative   Not on file   Social Determinants of Health   Financial Resource Strain: Not on file  Food Insecurity: Not on file  Transportation Needs: Not on file  Physical Activity: Not on file  Stress: Not on file  Social Connections: Not on file  Intimate Partner Violence: Not on file      Family History  Problem Relation Age of Onset   Emphysema Paternal Uncle        smoked   Heart disease Father    Stroke Father      Review of Systems Constitutional: negative for anorexia, fevers and sweats  Eyes: negative for irritation, redness and visual disturbance  Ears, nose, mouth, throat, and face: negative for earaches, epistaxis, nasal congestion and sore throat  Respiratory: negative for cough, dyspnea on exertion, sputum and wheezing  Cardiovascular: negative for chest pain, dyspnea, lower extremity edema, orthopnea, palpitations and syncope  Gastrointestinal: negative for abdominal pain, constipation, diarrhea, melena, nausea and vomiting  Genitourinary:negative for dysuria, frequency and hematuria  Hematologic/lymphatic: negative for bleeding, easy bruising and lymphadenopathy  Musculoskeletal:negative for arthralgias, muscle weakness and stiff joints  Neurological: negative for coordination problems, gait problems, headaches and weakness  Endocrine: negative for diabetic symptoms including polydipsia, polyuria and weight loss     Objective:   Physical Exam  Gen. Pleasant, well-nourished, in no distress, normal affect, blind ENT - no pallor,icterus, no post nasal drip Neck: No JVD, no thyromegaly, no carotid bruits Lungs: no use of accessory muscles, no dullness to percussion, clear without rales or rhonchi  Cardiovascular: Rhythm regular, heart sounds  normal, no murmurs or gallops, no peripheral edema Abdomen: soft and  non-tender, no hepatosplenomegaly, BS normal. Musculoskeletal: No deformities, no cyanosis or clubbing Neuro:  alert, non focal       Assessment & Plan:    Circadian rhythm disorder freerunning type, blind  -Discussed importance of not napping in the daytime.  Was sent to take melatonin at at night around bedtime and light exposure in the daytime

## 2021-09-02 NOTE — Assessment & Plan Note (Signed)
Tough situation.  He is on narcotics and benzodiazepines for anxiety .  Previous hypnotics including Ambien, 2 mg of Lunesta has not worked.  Lemborexant has also been tried.  He required up to 1200 mg of Seroquel due to tachyphylaxis.  He has now been off Seroquel for at least a month so I am hoping that the lower dose would work.  I will provide him with a prescription for 400 mg. I feel he may need combination therapy so we will add 3 mg of Lunesta to this. Will also add melatonin to help with circadian rhythm issues. Not sure what else we have to offer him in our clinic.  Could refer him to cognitive behavioral therapist but doubt this would be effective

## 2021-09-02 NOTE — Patient Instructions (Signed)
°  MELATONIN 10 MG DAILY  X RX SEROQUEL 400 MG AT BEDTIME  X RX LUNESTA 3 MG AT BEDTIME  LIGHT EXPOSURE X 30 MINS IN MORNING

## 2021-09-08 ENCOUNTER — Other Ambulatory Visit: Payer: Self-pay | Admitting: Pulmonary Disease

## 2021-09-09 MED ORDER — QUETIAPINE FUMARATE 400 MG PO TABS: 400.0000 mg | ORAL_TABLET | Freq: Every day | ORAL | 1 refills | Status: DC

## 2021-09-09 MED ORDER — ESZOPICLONE 3 MG PO TABS
3.0000 mg | ORAL_TABLET | Freq: Every day | ORAL | 1 refills | Status: DC
Start: 2021-09-09 — End: 2021-10-13

## 2021-09-09 NOTE — Telephone Encounter (Signed)
Noted.   Will route to provide.  RA please advise. Thanks

## 2021-09-09 NOTE — Telephone Encounter (Signed)
Refill on Seroquel and Lunesta has been sent. Subsequent refills will have to come from his PCP

## 2021-10-13 ENCOUNTER — Telehealth: Payer: Self-pay | Admitting: Pulmonary Disease

## 2021-10-13 MED ORDER — BELSOMRA 10 MG PO TABS: 10.0000 mg | ORAL_TABLET | Freq: Every day | ORAL | 2 refills | Status: AC

## 2021-10-13 NOTE — Telephone Encounter (Signed)
RX for Belsomra 10 mg x 1 month with 2 refills has been sent

## 2021-10-13 NOTE — Telephone Encounter (Signed)
Fax received from pt's pharmacy about the eszopiclone 3mg .  Drug not covered by pt's plan. Preferred alternative is trazodone hcl and belsomra.  Dr. Elsworth Soho, please advise on this. Pharmacy that the med needs to be sent to is [facility].

## 2021-10-14 NOTE — Telephone Encounter (Signed)
Called and spoke with patient to let him know that new RX has been sent in and that the other medication is not covered by his insurance. He expressed understanding. Nothing further needed at this time.

## 2021-10-19 ENCOUNTER — Other Ambulatory Visit (HOSPITAL_COMMUNITY): Payer: Self-pay

## 2021-10-28 ENCOUNTER — Telehealth: Payer: Self-pay | Admitting: Pulmonary Disease

## 2021-10-28 NOTE — Telephone Encounter (Signed)
Called and spoke to [place] from [place]. And told her that on 10/13/21 that we got a fax stating that pts insurance would not cover Luesta 3mg  and that Dr Elsworth Soho sent in [place] 10mg . Helene Kelp stated that she was just following up on medication. Nothing further  ?

## 2021-10-29 ENCOUNTER — Telehealth: Payer: Self-pay | Admitting: Pharmacy Technician

## 2021-10-29 NOTE — Telephone Encounter (Signed)
Patient Advocate Encounter ? ?Received notification from [facility] that prior authorization for [place] 3MG  Johnnye Sima) is required. ?  ?PA APPROVED 3.1.23 - 12.31.23 ?  ?[facility] will continue to follow ? ?Takyla Kuchera R Chiyoko Torrico, CPhT ?Patient Advocate ?Phone: [phone] ?Fax:  [phone] ? ? ?

## 2021-11-02 ENCOUNTER — Encounter (HOSPITAL_COMMUNITY): Payer: Self-pay | Admitting: Emergency Medicine

## 2021-11-02 ENCOUNTER — Other Ambulatory Visit: Payer: Self-pay

## 2021-11-02 ENCOUNTER — Emergency Department (HOSPITAL_COMMUNITY)
Admission: EM | Admit: 2021-11-02 | Discharge: 2021-11-02 | Disposition: A | Discharge status: Home / Self Care | Payer: Medicare HMO | Attending: Emergency Medicine | Admitting: Emergency Medicine

## 2021-11-02 ENCOUNTER — Emergency Department (HOSPITAL_COMMUNITY): Payer: Medicare HMO

## 2021-11-02 DIAGNOSIS — G720 Drug-induced myopathy: Secondary | ICD-10-CM

## 2021-11-02 DIAGNOSIS — Z7982 Long term (current) use of aspirin: Secondary | ICD-10-CM | POA: Diagnosis not present

## 2021-11-02 DIAGNOSIS — Z113 Encounter for screening for infections with a predominantly sexual mode of transmission: Secondary | ICD-10-CM | POA: Insufficient documentation

## 2021-11-02 DIAGNOSIS — Z79899 Other long term (current) drug therapy: Secondary | ICD-10-CM | POA: Diagnosis not present

## 2021-11-02 DIAGNOSIS — Z206 Contact with and (suspected) exposure to human immunodeficiency virus [HIV]: Secondary | ICD-10-CM | POA: Insufficient documentation

## 2021-11-02 DIAGNOSIS — Z7251 High risk heterosexual behavior: Secondary | ICD-10-CM | POA: Diagnosis not present

## 2021-11-02 DIAGNOSIS — N342 Other urethritis: Secondary | ICD-10-CM | POA: Diagnosis not present

## 2021-11-02 DIAGNOSIS — N50812 Left testicular pain: Secondary | ICD-10-CM | POA: Diagnosis present

## 2021-11-02 LAB — COMPREHENSIVE METABOLIC PANEL
ALT: 37 U/L (ref 0–44)
AST: 60 U/L — ABNORMAL HIGH (ref 15–41)
Albumin: 3.7 g/dL (ref 3.5–5.0)
Alkaline Phosphatase: 48 U/L (ref 38–126)
Anion gap: 10 (ref 5–15)
BUN: 14 mg/dL (ref 8–23)
CO2: 23 mmol/L (ref 22–32)
Calcium: 8.4 mg/dL — ABNORMAL LOW (ref 8.9–10.3)
Chloride: 103 mmol/L (ref 98–111)
Creatinine, Ser: 1.35 mg/dL — ABNORMAL HIGH (ref 0.61–1.24)
GFR, Estimated: 59 mL/min — ABNORMAL LOW (ref >60)
Glucose, Bld: 101 mg/dL — ABNORMAL HIGH (ref 70–99)
Potassium: 3.9 mmol/L (ref 3.5–5.1)
Sodium: 136 mmol/L (ref 135–145)
Total Bilirubin: 1.1 mg/dL (ref 0.3–1.2)
Total Protein: 6.3 g/dL — ABNORMAL LOW (ref 6.5–8.1)

## 2021-11-02 LAB — CK: Total CK: 1539 U/L — ABNORMAL HIGH (ref 49–397)

## 2021-11-02 LAB — URINALYSIS, ROUTINE W REFLEX MICROSCOPIC
Bilirubin Urine: NEGATIVE
Glucose, UA: NEGATIVE mg/dL
Ketones, ur: NEGATIVE mg/dL
Nitrite: NEGATIVE
Protein, ur: NEGATIVE mg/dL
Specific Gravity, Urine: 1.004 — ABNORMAL LOW (ref 1.005–1.030)
pH: 6 (ref 5.0–8.0)

## 2021-11-02 LAB — HIV ANTIBODY (ROUTINE TESTING W REFLEX): HIV Screen 4th Generation wRfx: NONREACTIVE

## 2021-11-02 LAB — CBC WITH DIFFERENTIAL/PLATELET
Abs Immature Granulocytes: 0.02 10*3/uL (ref 0.00–0.07)
Basophils Absolute: 0.1 10*3/uL (ref 0.0–0.1)
Basophils Relative: 1 %
Eosinophils Absolute: 0.1 10*3/uL (ref 0.0–0.5)
Eosinophils Relative: 1 %
HCT: 38.9 % — ABNORMAL LOW (ref 39.0–52.0)
Hemoglobin: 13.1 g/dL (ref 13.0–17.0)
Immature Granulocytes: 0 %
Lymphocytes Relative: 24 %
Lymphs Abs: 2.4 10*3/uL (ref 0.7–4.0)
MCH: 28.4 pg (ref 26.0–34.0)
MCHC: 33.7 g/dL (ref 30.0–36.0)
MCV: 84.4 fL (ref 80.0–100.0)
Monocytes Absolute: 0.7 10*3/uL (ref 0.1–1.0)
Monocytes Relative: 7 %
Neutro Abs: 6.7 10*3/uL (ref 1.7–7.7)
Neutrophils Relative %: 67 %
Platelets: 285 10*3/uL (ref 150–400)
RBC: 4.61 MIL/uL (ref 4.22–5.81)
RDW: 12.8 % (ref 11.5–15.5)
WBC: 9.9 10*3/uL (ref 4.0–10.5)
nRBC: 0 % (ref 0.0–0.2)

## 2021-11-02 LAB — RPR: RPR Ser Ql: NONREACTIVE

## 2021-11-02 MED ORDER — DOXYCYCLINE HYCLATE 100 MG PO CAPS: 100.0000 mg | ORAL_CAPSULE | Freq: Two times a day (BID) | ORAL | 0 refills | Status: DC

## 2021-11-02 MED ORDER — DOXYCYCLINE HYCLATE 100 MG PO TABS
100.0000 mg | ORAL_TABLET | Freq: Once | ORAL | Status: AC
  Administered 2021-11-02: 100 mg via ORAL
  Filled 2021-11-02: qty 1

## 2021-11-02 MED ORDER — OXYCODONE-ACETAMINOPHEN 5-325 MG PO TABS
1.0000 | ORAL_TABLET | Freq: Once | ORAL | Status: AC
  Administered 2021-11-02: 1 via ORAL
  Filled 2021-11-02: qty 1

## 2021-11-02 MED ORDER — LACTATED RINGERS IV BOLUS
1000.0000 mL | Freq: Once | INTRAVENOUS | Status: AC
Start: 2021-11-02 — End: 2021-11-02
  Administered 2021-11-02: 1000 mL via INTRAVENOUS

## 2021-11-02 MED ORDER — LACTATED RINGERS IV BOLUS
1000.0000 mL | Freq: Once | INTRAVENOUS | Status: AC
  Administered 2021-11-02: 1000 mL via INTRAVENOUS

## 2021-11-02 MED ORDER — LIDOCAINE HCL (PF) 1 % IJ SOLN
INTRAMUSCULAR | Status: AC
  Administered 2021-11-02: 5 mL
  Filled 2021-11-02: qty 5

## 2021-11-02 MED ORDER — CEFTRIAXONE SODIUM 500 MG IJ SOLR
500.0000 mg | Freq: Once | INTRAMUSCULAR | Status: AC
  Administered 2021-11-02: 500 mg via INTRAMUSCULAR
  Filled 2021-11-02: qty 500

## 2021-11-02 MED ORDER — OXYCODONE HCL 5 MG PO TABS
5.0000 mg | ORAL_TABLET | Freq: Once | ORAL | Status: AC
  Administered 2021-11-02: 5 mg via ORAL
  Filled 2021-11-02: qty 1

## 2021-11-02 NOTE — ED Notes (Signed)
Pt aware of need for urine sample, states he is unable to provide urine sample at this time.  ?

## 2021-11-02 NOTE — ED Notes (Signed)
RN attempting to discharge pt, pt refusing to take discharge papers and is asking this RN about weight loss that has occurred over 1 year. RN informed pt the importance of healthy diet and following up with PCP. Pt began arguing with this RN, demanding sandwiches. Pt refusing to wear a mask.  ?

## 2021-11-02 NOTE — ED Triage Notes (Signed)
Pt BIB [facility] for penile bleeding since last Friday, EMS noted no blood. Pt not on any blood thinners. Last STD test was in January. Pt endorses possible contact with a person who is HIV+. Pt endorses lower back, leg, and testicle pain with generalized weakness.  ?

## 2021-11-02 NOTE — ED Notes (Signed)
Pt verbalizes understanding of discharge instructions. Opportunity for questions and answers were provided. Pt discharged from the ED.   ?

## 2021-11-02 NOTE — ED Provider Notes (Signed)
[facility] EMERGENCY DEPARTMENT Provider Note   CSN: [id number] Arrival date & time: 11/02/21  0735     History  Chief Complaint  Patient presents with   Penile Bleeding    Benjamin Morales is a 61 y.o. male.  HPI 61 year old male presents with a chief complaint of drainage from his penis.  He states this has been ongoing for the last 3 to 4 days.  He is legally blind and cannot quite tell what color it is but it appears dark and he is worried his blood.  It seems to run out of his penis throughout the day.  His penis also hurts and there is pain with urination.  Is having left testicular pain as well.  Does not feel like his scrotum is swollen.  No abdominal pain.  He has chronic back pain as well and he states his legs are "on fire".  Thus been going on for several days as well.  No fevers during this time.  He is worried he has HIV because he had unprotected sex with someone who had AIDS back in September.  He states he was tested in December and is due to get tested again next month but wants to be tested now.  He states has been feeling diffusely weak with no appetite for months.   Home Medications Prior to Admission medications   Medication Sig Start Date End Date Taking? Authorizing Provider  doxycycline (VIBRAMYCIN) 100 MG capsule Take 1 capsule (100 mg total) by mouth 2 (two) times daily. One po bid x 7 days 11/02/21  Yes Pricilla Loveless, MD  albuterol (VENTOLIN HFA) 108 (90 Base) MCG/ACT inhaler Inhale 1 puff into the lungs 2 (two) times daily as needed for wheezing.  12/01/19   [provider]  ALPRAZolam Prudy Feeler) 1 MG tablet Take 1 mg by mouth every morning. 10/16/20   [provider]  amLODipine (NORVASC) 10 MG tablet Take 10 mg by mouth every morning. 02/02/15   [provider]  aspirin EC 81 MG tablet Take 1 tablet (81 mg total) by mouth daily. 08/22/15   Jeralyn Bennett, MD  Cholecalciferol (VITAMIN D) 50 MCG (2000 UT) tablet Take  2,000 Units by mouth every morning.    [provider]  fluticasone (FLONASE) 50 MCG/ACT nasal spray Place 1 spray into both nostrils at bedtime as needed for allergies. 09/22/17   [provider]  hydrochlorothiazide (HYDRODIURIL) 25 MG tablet Take 25 mg by mouth every morning. 02/11/20   [provider]  hydrOXYzine (ATARAX) 25 MG tablet Take 25-50 mg by mouth at bedtime. 07/31/21   [provider]  losartan (COZAAR) 100 MG tablet Take 100 mg by mouth at bedtime. 02/02/15   [provider]  metoprolol tartrate (LOPRESSOR) 25 MG tablet Take 1 tablet (25 mg total) by mouth 2 (two) times daily. 02/22/20 05/22/20  Tessa Lerner, DO  Multiple Vitamin (MULTIVITAMIN WITH MINERALS) TABS tablet Take 2 tablets by mouth every morning.    [provider]  naproxen sodium (ALEVE) 220 MG tablet Take 440 mg by mouth 2 (two) times daily as needed (pain).    [provider]  nitroGLYCERIN (NITROSTAT) 0.4 MG SL tablet Place 1 tablet (0.4 mg total) under the tongue every 5 (five) minutes x 3 doses as needed for chest pain. 02/08/20   Leone Brand, NP  omeprazole (PRILOSEC) 20 MG capsule Take 20 mg by mouth See admin instructions. Take one capsule (20 mg) by mouth every morning,  take one capsule (20 mg) in the evening as needed for heartburn/acid reflux 01/26/15   [provider]  oxyCODONE-acetaminophen (PERCOCET) 10-325 MG per tablet Take 1 tablet by mouth every 3 (three) hours as needed for pain. 02/25/15   [provider]  QUEtiapine (SEROQUEL) 400 MG tablet Take 1 tablet (400 mg total) by mouth at bedtime. 09/09/21   Oretha Milch, MD  Suvorexant (BELSOMRA) 10 MG TABS Take 10 mg by mouth at bedtime. 10/13/21 11/12/21  Oretha Milch, MD  tamsulosin (FLOMAX) 0.4 MG CAPS capsule Take 1 capsule (0.4 mg total) by mouth daily after breakfast. 11/02/20   Placido Sou, PA-C  temazepam (RESTORIL) 30 MG capsule Take 1 capsule (30 mg total) by mouth at  bedtime as needed for sleep. 07/21/21   Tomma Lightning, MD  valACYclovir (VALTREX) 500 MG tablet Take 500 mg by mouth every morning. 09/17/20   [provider]  vitamin B-12 (CYANOCOBALAMIN) 100 MCG tablet Take 100 mcg by mouth every morning.    [provider]      Allergies    Thorazine [chlorpromazine]    Review of Systems   Review of Systems  Constitutional:  Negative for fever.  Gastrointestinal:  Negative for abdominal pain.  Genitourinary:  Positive for dysuria, hematuria, penile discharge, penile pain and testicular pain. Negative for scrotal swelling.  Musculoskeletal:  Positive for back pain and myalgias.   Physical Exam Updated Vital Signs BP (!) 157/93   Pulse (!) 107   Temp 98.3 F (36.8 C) (Oral)   Resp 16   Ht 5\' 9"  (1.753 m)   Wt 78.5 kg   SpO2 100%   BMI 25.55 kg/m  Physical Exam Vitals and nursing note reviewed.  Constitutional:      Appearance: He is well-developed. He is not diaphoretic.  HENT:     Head: Normocephalic and atraumatic.  Cardiovascular:     Rate and Rhythm: Normal rate and regular rhythm.     Heart sounds: Normal heart sounds.  Pulmonary:     Effort: Pulmonary effort is normal.     Breath sounds: Normal breath sounds.  Abdominal:     General: There is no distension.     Palpations: Abdomen is soft.     Tenderness: There is no abdominal tenderness.  Genitourinary:    Penis: Circumcised. Tenderness and discharge present. No swelling.      Testes:        Right: Tenderness or swelling not present.        Left: Tenderness (mild) present. Swelling not present.     Comments: No blood at meatus. Mild amount of yellow discharge Skin:    General: Skin is warm and dry.  Neurological:     Mental Status: He is alert.     Comments: 5/5 strength in BLE. Grossly normal sensation    ED Results / Procedures / Treatments   Labs (all labs ordered are listed, but only abnormal results are displayed) Labs Reviewed   URINALYSIS, ROUTINE W REFLEX MICROSCOPIC - Abnormal; Notable for the following components:      Result Value   Color, Urine STRAW (*)    Specific Gravity, Urine 1.004 (*)    Hgb urine dipstick SMALL (*)    Leukocytes,Ua MODERATE (*)    Bacteria, UA RARE (*)    All other components within normal limits  COMPREHENSIVE METABOLIC PANEL - Abnormal; Notable for the following components:   Glucose, Bld 101 (*)    Creatinine, Ser 1.35 (*)  Calcium 8.4 (*)    Total Protein 6.3 (*)    AST 60 (*)    GFR, Estimated 59 (*)    All other components within normal limits  CBC WITH DIFFERENTIAL/PLATELET - Abnormal; Notable for the following components:   HCT 38.9 (*)    All other components within normal limits  CK - Abnormal; Notable for the following components:   Total CK 1,539 (*)    All other components within normal limits  URINE CULTURE  RPR  HIV ANTIBODY (ROUTINE TESTING W REFLEX)  GC/CHLAMYDIA PROBE AMP ([place]) NOT AT [facility]    EKG None  Radiology US SCROTUM W/DOPPLER  Result Date: 11/02/2021 CLINICAL DATA:  LEFT testicular pain EXAM: SCROTAL ULTRASOUND DOPPLER ULTRASOUND OF THE TESTICLES TECHNIQUE: Complete ultrasound examination of the testicles, epididymis, and other scrotal structures was performed. Color and spectral Doppler ultrasound were also utilized to evaluate blood flow to the testicles. COMPARISON:  None FINDINGS: Right testicle Measurements: 4.1 x 2.0 x 2.5 cm. Normal echogenicity without mass or calcification. Internal blood flow present on color Doppler imaging. Left testicle Measurements: 4.0 x 2.4 x 2.5 cm. Normal echogenicity without mass or calcification. Internal blood flow present on color Doppler imaging. Right epididymis:  Tiny RIGHT epididymal cyst 3 x 3 x 3 mm Left epididymis:  Small LEFT epididymal cyst 5 x 3 x 2 mm Hydrocele:  Small BILATERAL hydroceles Varicocele:  None visualized. Pulsed Doppler interrogation of both testes demonstrates normal low  resistance arterial and venous waveforms bilaterally. Scrotal wall thickening seen bilaterally. IMPRESSION: Scrotal wall thickening and small BILATERAL hydroceles. Tiny BILATERAL epididymal cysts. Normal appearing testes without evidence of mass or torsion. Electronically Signed   By: Ulyses Southward M.D.   On: 11/02/2021 10:23    Procedures Procedures    Medications Ordered in ED Medications  lactated ringers bolus 1,000 mL (0 mLs Intravenous Stopped 11/02/21 1235)  oxyCODONE (Oxy IR/ROXICODONE) immediate release tablet 5 mg (5 mg Oral Given 11/02/21 0841)  oxyCODONE-acetaminophen (PERCOCET/ROXICET) 5-325 MG per tablet 1 tablet (1 tablet Oral Given 11/02/21 0841)  cefTRIAXone (ROCEPHIN) injection 500 mg (500 mg Intramuscular Given 11/02/21 0844)  doxycycline (VIBRA-TABS) tablet 100 mg (100 mg Oral Given 11/02/21 0841)  lidocaine (PF) (XYLOCAINE) 1 % injection (5 mLs  Given 11/02/21 0847)  lactated ringers bolus 1,000 mL (0 mLs Intravenous Stopped 11/02/21 1235)    ED Course/ Medical Decision Making/ A&P                           Medical Decision Making Amount and/or Complexity of Data Reviewed Labs: ordered. Radiology: ordered. ECG/medicine tests: ordered.  Risk Prescription drug management.   Patient seems to have a lot better pain relief after being given his home dose of oxycodone.  There are no neurologic deficits.  While he has chronic back pain, my suspicion for an acute CNS emergency is pretty low.  From a urinary/penile perspective, there is no blood but it does look like penile discharge and given history of unprotected sex we will give Rocephin and doxycycline.  From a muscle pain perspective, especially in his legs, his CK is mildly elevated into the 1500 range.  I suspect this is probably related to his atorvastatin.  Urinalysis is okay with small amount of blood/myoglobinuria but his renal function is okay and electrolytes are okay on my read.  Thus I think is reasonable to discharge home  with stopping his atorvastatin and drinking plenty of fluids.  He was given 2 L IV fluids here.  He feels well enough for discharge at this point.  I did review the ultrasound images of his scrotum given he had some mild testicular tenderness.  There is no obvious torsion or swelling.  Radiology commented on some possible wall thickening of the scrotum but I do not appreciate this on exam.  Will discharge home with return precautions.        Final Clinical Impression(s) / ED Diagnoses Final diagnoses:  Urethritis  Statin myopathy    Rx / DC Orders ED Discharge Orders          Ordered    doxycycline (VIBRAMYCIN) 100 MG capsule  2 times daily        11/02/21 1216              Pricilla Loveless, MD 11/02/21 1642

## 2021-11-02 NOTE — ED Notes (Signed)
Patient transported to Ultrasound 

## 2021-11-02 NOTE — Discharge Instructions (Addendum)
STOP your ATORVASTATIN.  This is probably causing your muscle pain.  You will need to call your doctor for a different medicine. ? ?You are being treated for a penile infection with the antibiotic doxycycline.  Do not have intercourse until this is cleared up.  Be sure you are drinking plenty of fluids. ?

## 2021-11-03 LAB — GC/CHLAMYDIA PROBE AMP (~~LOC~~) NOT AT ARMC
Chlamydia: POSITIVE — AB
Comment: NEGATIVE
Comment: NORMAL
Neisseria Gonorrhea: POSITIVE — AB

## 2021-11-03 LAB — URINE CULTURE: Culture: NO GROWTH

## 2021-11-19 ENCOUNTER — Other Ambulatory Visit: Payer: Self-pay | Admitting: Pulmonary Disease

## 2021-11-19 MED ORDER — QUETIAPINE FUMARATE 400 MG PO TABS: 400.0000 mg | ORAL_TABLET | Freq: Every day | ORAL | 1 refills | Status: DC

## 2021-11-19 NOTE — Telephone Encounter (Signed)
Okay to refill ?Please direct subsequent refills to PCP ?

## 2021-11-19 NOTE — Telephone Encounter (Signed)
Fax received for a refill request of Quetiapine '400mg'$  to be sent to pt's mail order pharmacy. Dr. Elsworth Soho, please advise if you are okay refilling med for pt. ?

## 2022-01-11 ENCOUNTER — Other Ambulatory Visit: Payer: Self-pay | Admitting: Pulmonary Disease

## 2022-01-11 NOTE — Telephone Encounter (Signed)
Last OV 09/02/21. Please advise on refill.  ?

## 2022-01-12 NOTE — Telephone Encounter (Signed)
Refill x 3 sent to mail order pharmacy ?Please have him make appt with APP ?Further refills can be with PCP ?

## 2022-02-18 ENCOUNTER — Other Ambulatory Visit: Payer: Self-pay | Admitting: Family Medicine

## 2022-02-18 ENCOUNTER — Ambulatory Visit
Admission: RE | Admit: 2022-02-18 | Discharge: 2022-02-18 | Disposition: A | Discharge status: Home / Self Care | Payer: Medicare HMO | Source: Ambulatory Visit | Attending: Family Medicine | Admitting: Family Medicine

## 2022-02-18 DIAGNOSIS — M79674 Pain in right toe(s): Secondary | ICD-10-CM

## 2022-02-18 DIAGNOSIS — M79671 Pain in right foot: Secondary | ICD-10-CM

## 2022-03-25 ENCOUNTER — Ambulatory Visit (INDEPENDENT_AMBULATORY_CARE_PROVIDER_SITE_OTHER): Payer: Medicare HMO

## 2022-03-25 ENCOUNTER — Ambulatory Visit (INDEPENDENT_AMBULATORY_CARE_PROVIDER_SITE_OTHER): Payer: Medicare HMO | Admitting: Podiatry

## 2022-03-25 ENCOUNTER — Encounter: Payer: Self-pay | Admitting: Podiatry

## 2022-03-25 DIAGNOSIS — M7751 Other enthesopathy of right foot: Secondary | ICD-10-CM | POA: Diagnosis not present

## 2022-03-25 DIAGNOSIS — M779 Enthesopathy, unspecified: Secondary | ICD-10-CM | POA: Diagnosis not present

## 2022-03-25 MED ORDER — TRIAMCINOLONE ACETONIDE 10 MG/ML IJ SUSP
10.0000 mg | Freq: Once | INTRAMUSCULAR | Status: AC
  Administered 2022-03-25: 10 mg

## 2022-03-25 NOTE — Progress Notes (Signed)
Subjective:   Patient ID: Benjamin Morales, male   DOB: 61 y.o.   MRN: [id number]   HPI Patient presents stating he has been getting a lot of pain on top of his right foot for around a year when he injured it by kicking a table.  He did not have x-rays at that time not sure about a broken bone and he does get occasional calluses also.  Patient does smoke e-cigarettes and does have eyesight issues.  Patient tries to be active as best he can   Review of Systems  All other systems reviewed and are negative.       Objective:  Physical Exam Vitals and nursing note reviewed.  Constitutional:      Appearance: He is well-developed.  Pulmonary:     Effort: Pulmonary effort is normal.  Musculoskeletal:        General: Normal range of motion.  Skin:    General: Skin is warm.  Neurological:     Mental Status: He is alert.     Neurovascular status intact muscle strength adequate range of motion within normal limits with patient found to have discomfort in the dorsal foot around the lesser MPJs mostly around the third fourth and fifth with the fourth being worse.  Mild hammertoe deformity keratotic lesion formation bilateral     Assessment:  Inflammatory capsulitis of the third fourth and fifth MPJ right foot with moderate discomfort     Plan:  H&P x-ray reviewed went ahead today did sterile prep did periarticular injection around the third fourth fifth MPJs and utilize 3 mg Kenalog 5 mg Xylocaine advised on supportive shoes reappoint as needed.  Discussed keratotic lesions I think he can get pedicures and probably keep this under good control  X-rays indicate no signs that he fractured a bone secondary to the injury that he experienced

## 2022-04-29 ENCOUNTER — Other Ambulatory Visit: Payer: Self-pay | Admitting: Pulmonary Disease

## 2022-04-30 NOTE — Telephone Encounter (Signed)
Please advise on med refill. 

## 2022-05-31 ENCOUNTER — Encounter: Payer: Self-pay | Admitting: Podiatry

## 2022-05-31 ENCOUNTER — Ambulatory Visit (INDEPENDENT_AMBULATORY_CARE_PROVIDER_SITE_OTHER): Payer: Medicare HMO | Admitting: Podiatry

## 2022-05-31 DIAGNOSIS — M7751 Other enthesopathy of right foot: Secondary | ICD-10-CM

## 2022-05-31 MED ORDER — TRIAMCINOLONE ACETONIDE 10 MG/ML IJ SUSP
10.0000 mg | Freq: Once | INTRAMUSCULAR | Status: AC
  Administered 2022-05-31: 10 mg

## 2022-05-31 NOTE — Progress Notes (Signed)
Subjective:   Patient ID: Benjamin Morales, male   DOB: 61 y.o.   MRN: [id number]   HPI Patient states he was doing well but the pain came back seems to be in a slightly different spot   ROS      Objective:  Physical Exam  Neurovascular status intact with pain more around the third MPJ now with discomfort in this area     Assessment:  Inflammatory capsulitis of the third MPJ right with pain     Plan:  Reviewed condition may require more aggressive but at this point sterile prep and injected around the joint surface 3 mg Dexasone Kenalog 5 mg Xylocaine periarticular and reappoint as symptoms indicate

## 2022-06-19 ENCOUNTER — Emergency Department (HOSPITAL_COMMUNITY)
Admission: EM | Admit: 2022-06-19 | Discharge: 2022-06-20 | Disposition: A | Discharge status: Home / Self Care | Payer: Medicare HMO | Attending: Emergency Medicine | Admitting: Emergency Medicine

## 2022-06-19 ENCOUNTER — Emergency Department (HOSPITAL_COMMUNITY): Payer: Medicare HMO

## 2022-06-19 ENCOUNTER — Encounter (HOSPITAL_COMMUNITY): Payer: Self-pay | Admitting: Emergency Medicine

## 2022-06-19 DIAGNOSIS — Z79899 Other long term (current) drug therapy: Secondary | ICD-10-CM | POA: Diagnosis not present

## 2022-06-19 DIAGNOSIS — R609 Edema, unspecified: Secondary | ICD-10-CM | POA: Diagnosis not present

## 2022-06-19 DIAGNOSIS — L299 Pruritus, unspecified: Secondary | ICD-10-CM | POA: Diagnosis not present

## 2022-06-19 DIAGNOSIS — R109 Unspecified abdominal pain: Secondary | ICD-10-CM | POA: Diagnosis not present

## 2022-06-19 DIAGNOSIS — E876 Hypokalemia: Secondary | ICD-10-CM | POA: Diagnosis not present

## 2022-06-19 DIAGNOSIS — I10 Essential (primary) hypertension: Secondary | ICD-10-CM | POA: Diagnosis not present

## 2022-06-19 DIAGNOSIS — T502X5A Adverse effect of carbonic-anhydrase inhibitors, benzothiadiazides and other diuretics, initial encounter: Secondary | ICD-10-CM

## 2022-06-19 DIAGNOSIS — Z7982 Long term (current) use of aspirin: Secondary | ICD-10-CM | POA: Insufficient documentation

## 2022-06-19 DIAGNOSIS — R21 Rash and other nonspecific skin eruption: Secondary | ICD-10-CM | POA: Diagnosis present

## 2022-06-19 NOTE — ED Provider Triage Note (Signed)
Emergency Medicine Provider Triage Evaluation Note  Benjamin Morales , a 61 y.o. male  was evaluated in triage.  Pt has multiple complaints. Complaining of bilateral leg swelling, skin burning and peeling, bilateral flank pain, and shortness of breath. All for varying durations. States he called his doctor today and they told him his "kidneys are failing" and he needs to be seen. Difficult to redirect patient  Review of Systems  Positive: As above Negative: CP  Physical Exam  BP (!) 161/100 (BP Location: Left Arm)   Pulse 88   Temp 98.8 F (37.1 C) (Oral)   Resp 18   SpO2 100%  Gen:   Awake, no distress   Resp:  Normal effort  MSK:   Moves extremities without difficulty  Other:    Medical Decision Making  Medically screening exam initiated at 11:24 PM.  Appropriate orders placed.  Benjamin Morales was informed that the remainder of the evaluation will be completed by another provider, this initial triage assessment does not replace that evaluation, and the importance of remaining in the ED until their evaluation is complete.  Workup initiated   Benjamin Morales T, PA-C 06/19/22 2325

## 2022-06-19 NOTE — ED Triage Notes (Signed)
Pt reports flank pain, MSE in progress.

## 2022-06-20 DIAGNOSIS — L299 Pruritus, unspecified: Secondary | ICD-10-CM | POA: Diagnosis not present

## 2022-06-20 LAB — COMPREHENSIVE METABOLIC PANEL
ALT: 33 U/L (ref 0–44)
AST: 29 U/L (ref 15–41)
Albumin: 4.7 g/dL (ref 3.5–5.0)
Alkaline Phosphatase: 51 U/L (ref 38–126)
Anion gap: 10 (ref 5–15)
BUN: 16 mg/dL (ref 8–23)
CO2: 23 mmol/L (ref 22–32)
Calcium: 9 mg/dL (ref 8.9–10.3)
Chloride: 98 mmol/L (ref 98–111)
Creatinine, Ser: 1.15 mg/dL (ref 0.61–1.24)
GFR, Estimated: >60 mL/min (ref >60)
Glucose, Bld: 103 mg/dL — ABNORMAL HIGH (ref 70–99)
Potassium: 3.3 mmol/L — ABNORMAL LOW (ref 3.5–5.1)
Sodium: 131 mmol/L — ABNORMAL LOW (ref 135–145)
Total Bilirubin: 0.9 mg/dL (ref 0.3–1.2)
Total Protein: 8.3 g/dL — ABNORMAL HIGH (ref 6.5–8.1)

## 2022-06-20 LAB — URINALYSIS, ROUTINE W REFLEX MICROSCOPIC
Bilirubin Urine: NEGATIVE
Glucose, UA: NEGATIVE mg/dL
Hgb urine dipstick: NEGATIVE
Ketones, ur: NEGATIVE mg/dL
Leukocytes,Ua: NEGATIVE
Nitrite: NEGATIVE
Protein, ur: NEGATIVE mg/dL
Specific Gravity, Urine: <1.005 — ABNORMAL LOW (ref 1.005–1.030)
pH: 6 (ref 5.0–8.0)

## 2022-06-20 LAB — CBC
HCT: 40.8 % (ref 39.0–52.0)
Hemoglobin: 14 g/dL (ref 13.0–17.0)
MCH: 28.5 pg (ref 26.0–34.0)
MCHC: 34.3 g/dL (ref 30.0–36.0)
MCV: 83.1 fL (ref 80.0–100.0)
Platelets: 342 10*3/uL (ref 150–400)
RBC: 4.91 MIL/uL (ref 4.22–5.81)
RDW: 13.1 % (ref 11.5–15.5)
WBC: 7.9 10*3/uL (ref 4.0–10.5)
nRBC: 0 % (ref 0.0–0.2)

## 2022-06-20 MED ORDER — FUROSEMIDE 40 MG PO TABS: 40.0000 mg | ORAL_TABLET | Freq: Every day | ORAL | 0 refills | Status: DC

## 2022-06-20 MED ORDER — DIPHENHYDRAMINE HCL 25 MG PO CAPS
25.0000 mg | ORAL_CAPSULE | Freq: Once | ORAL | Status: AC
  Administered 2022-06-20: 25 mg via ORAL
  Filled 2022-06-20: qty 1

## 2022-06-20 MED ORDER — POTASSIUM CHLORIDE CRYS ER 20 MEQ PO TBCR: 20.0000 meq | EXTENDED_RELEASE_TABLET | Freq: Every day | ORAL | 0 refills | Status: DC

## 2022-06-20 MED ORDER — POTASSIUM CHLORIDE CRYS ER 20 MEQ PO TBCR
40.0000 meq | EXTENDED_RELEASE_TABLET | Freq: Once | ORAL | Status: AC
  Administered 2022-06-20: 40 meq via ORAL
  Filled 2022-06-20: qty 2

## 2022-06-20 MED ORDER — CETIRIZINE HCL 10 MG PO CHEW: 10.0000 mg | CHEWABLE_TABLET | Freq: Every day | ORAL | 0 refills | Status: DC

## 2022-06-20 NOTE — Discharge Instructions (Addendum)
You may take diphenhydramine (Benadryl) as needed for itching that is not controlled by cetirizine.
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
## 2022-06-24 ENCOUNTER — Other Ambulatory Visit: Payer: Self-pay | Admitting: Pulmonary Disease

## 2022-06-24 ENCOUNTER — Other Ambulatory Visit: Payer: Self-pay

## 2022-06-24 MED ORDER — ESZOPICLONE 3 MG PO TABS: ORAL_TABLET | ORAL | 1 refills | Status: DC

## 2022-06-30 ENCOUNTER — Telehealth: Payer: Self-pay

## 2022-06-30 NOTE — Telephone Encounter (Signed)
Rx mailed and nothing further needed.

## 2022-08-04 ENCOUNTER — Other Ambulatory Visit: Payer: Self-pay | Admitting: Pulmonary Disease

## 2022-08-09 ENCOUNTER — Other Ambulatory Visit: Payer: Self-pay | Admitting: Pulmonary Disease

## 2022-08-10 NOTE — Telephone Encounter (Signed)
Pt calling again. States script has not been called in. Please call to advise. Tnx.Eszopiclone 3 MG TABS

## 2022-08-11 ENCOUNTER — Telehealth: Payer: Self-pay | Admitting: Pulmonary Disease

## 2022-08-16 MED ORDER — ESZOPICLONE 3 MG PO TABS
ORAL_TABLET | ORAL | 0 refills | Status: DC
Start: 2022-08-16 — End: 2022-08-16

## 2022-08-16 MED ORDER — ESZOPICLONE 3 MG PO TABS: 3.0000 mg | ORAL_TABLET | Freq: Every day | ORAL | 0 refills | Status: DC

## 2022-08-16 MED ORDER — ESZOPICLONE 3 MG PO TABS: ORAL_TABLET | ORAL | 0 refills | Status: DC

## 2022-08-16 NOTE — Telephone Encounter (Signed)
Refill has to be sent by you Dr. Elsworth Soho.

## 2022-08-16 NOTE — Addendum Note (Signed)
Addended by: Gavin Potters R on: 08/16/2022 11:43 AM   Modules accepted: Orders

## 2022-08-16 NOTE — Addendum Note (Signed)
Addended by: Gavin Potters R on: 08/16/2022 11:48 AM   Modules accepted: Orders

## 2022-08-16 NOTE — Telephone Encounter (Signed)
Spoke with pt who states he needs refill of Eszopiclone '3mg'$  sent to [facility] and [address] Dr. Elsworth Soho please advise.

## 2022-08-16 NOTE — Telephone Encounter (Signed)
Same as call last week which reads:  Patient states has not received RX for Eszopiclone.Confirmed address to be correct. Patient phone number is [phone].

## 2022-08-16 NOTE — Addendum Note (Signed)
Addended by: Rigoberto Noel on: 08/16/2022 02:50 PM   Modules accepted: Orders

## 2022-08-16 NOTE — Telephone Encounter (Signed)
Done. Please arrange for office visit with APP

## 2022-09-15 ENCOUNTER — Other Ambulatory Visit: Payer: Self-pay | Admitting: Pulmonary Disease

## 2022-09-21 ENCOUNTER — Telehealth: Payer: Self-pay | Admitting: Pulmonary Disease

## 2022-09-22 NOTE — Telephone Encounter (Signed)
Called patient and he states he is needing a refill on his Lunesta sleep medication '3mg'$ . He states he takes 1 tablet at bedtime and that he is out of medication  Please advise sir

## 2022-09-22 NOTE — Telephone Encounter (Signed)
Called patient and left voicemail for him that he is needing to make a follow up visit with [place] before Dr Elsworth Soho can send in refills. Nothing further needed

## 2022-09-23 ENCOUNTER — Other Ambulatory Visit: Payer: Self-pay | Admitting: Pulmonary Disease

## 2022-09-30 ENCOUNTER — Ambulatory Visit: Payer: Medicare Other | Admitting: Nurse Practitioner

## 2022-10-05 ENCOUNTER — Ambulatory Visit: Payer: Medicare HMO | Admitting: Primary Care

## 2022-10-08 ENCOUNTER — Ambulatory Visit (INDEPENDENT_AMBULATORY_CARE_PROVIDER_SITE_OTHER): Payer: Medicare HMO | Admitting: Nurse Practitioner

## 2022-10-08 ENCOUNTER — Encounter: Payer: Self-pay | Admitting: Nurse Practitioner

## 2022-10-08 VITALS — BP 118/72 | HR 74 | Temp 97.8°F | Ht 69.0 in | Wt 196.2 lb

## 2022-10-08 DIAGNOSIS — G4720 Circadian rhythm sleep disorder, unspecified type: Secondary | ICD-10-CM

## 2022-10-08 DIAGNOSIS — F5104 Psychophysiologic insomnia: Secondary | ICD-10-CM | POA: Diagnosis not present

## 2022-10-08 DIAGNOSIS — R0789 Other chest pain: Secondary | ICD-10-CM

## 2022-10-08 DIAGNOSIS — G4724 Circadian rhythm sleep disorder, free running type: Secondary | ICD-10-CM | POA: Diagnosis not present

## 2022-10-08 MED ORDER — ESZOPICLONE 3 MG PO TABS: 3.0000 mg | ORAL_TABLET | Freq: Every day | ORAL | 0 refills | Status: DC

## 2022-10-08 MED ORDER — QUETIAPINE FUMARATE 400 MG PO TABS: 400.0000 mg | ORAL_TABLET | Freq: Every day | ORAL | 1 refills | Status: AC

## 2022-10-08 NOTE — Assessment & Plan Note (Signed)
Intermittent chest pains over the past few months. Undergoing evaluation with cardiology. ED precautions advised.

## 2022-10-08 NOTE — Assessment & Plan Note (Signed)
Encouraged him to continue with regular bedtime schedule and exposure to light during the day. He will continue melatonin at night.

## 2022-10-08 NOTE — Assessment & Plan Note (Signed)
Difficulties treating in the past. Seems to be doing well with Lunesta, seroquel and melatonin combination. We did discuss the importance of consistent follow up and that if he does not follow up regularly, we cannot refill his medications. He verbalized understanding. Rx refilled today. Medication education provided. 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 Sleep hygiene reviewed.   Patient Instructions  Continue melatonin 10 mg At bedtime  Continue Lunesta 1 tablet At bedtime. Ensure you go to bed after taking this Continue quetiapine (Seroquel) 400 mg At bedtime   Attend your appointment with your heart doctor Monday. If your chest discomfort returns/worsens, go to the emergency department for further evaluation.   Follow up with Dr. Elsworth Soho in 6 months, or sooner, if needed.

## 2022-10-08 NOTE — Progress Notes (Signed)
$@Patientr$  ID: Benjamin Morales, male    DOB: 1961-08-05, 62 y.o.   MRN: [id number]  Chief Complaint  Patient presents with   Follow-up    Chest pain.  Seeing cardiologist 10/11/22.  Some wheeze.  Has been out of inhaler x 2 weeks.    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 He is a patient of Dr. Bari Mantis and last seen in office 09/02/2021.  Past medical history significant for hypertension, unstable angina, GERD.  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  TEST/EVENTS:   09/02/2021: OV with Dr. Elsworth Soho for initial consult.  Previously seen by Dr. Ander Slade in 2022.  Tried on Lunesta 2 mg then but was not effective.  Temazepam was contraindicated since he was already on benzodiazepines.  States that his insomnia started around 2005 when he was injured at work and developed back pain.  He required chronic pain medications and could not sleep.  Later this took on a life of its own.  He has tried multiple agents in the past.  Ambien did not work.  Trazodone did not work.  Seroquel did help.  He was titrated over the years from 100 mg to his current dose of 1200 mg.  PCP was concerned about high dose of Seroquel and stopped prescribing.  He is on oxycodone for chronic back pain and Xanax for chronic anxiety.  Reviewed importance of not napping in the daytime.  Was advised to take melatonin at night around bedtime and light exposure in the daytime.  Since he has been off his Seroquel for at least a month, hopeful that lower dose of work.  Rx sent for 400 mg nightly.  May need combination therapy with Lunesta; Rx for 3 mg sent.  If this does not improve his symptoms, would consider referring him to cognitive behavioral therapist.  10/08/2022: Today-follow-up Patient presents today for overdue follow-up.  He was last seen in January 2023.   He has been using Lunesta, Seroquel and melatonin since.  Having good success with these.  He has been out of the [place] for the last month.  Having some trouble with his sleep due to this.  Otherwise, when he was on them, he was able to sleep from 830/9 pm until 5 AM without any difficulties.  Denies any adverse side effects.  Denies any sleep parasomnias/paralysis.  No aberrant behavior.  He is having some trouble with intermittent chest pain over the past few months, which he is being evaluated by cardiology for.  Plan is to see them again in 3 days.  No active chest pain at his visit.  Denies palpitations, dizziness or lower extremity swelling.  Allergies  Allergen Reactions   Thorazine [Chlorpromazine] Anaphylaxis    Immunization History  Administered Date(s) Administered   Influenza-Unspecified 06/13/2021   Moderna Sars-Covid-2 Vaccination 10/16/2019, 11/14/2019    Past Medical History:  Diagnosis Date   HLD (hyperlipidemia)    Hypertension    Legally blind    Low back pain     Tobacco History: Social History   Tobacco Use  Smoking Status Some Days   Types: E-cigarettes  Smokeless Tobacco Never  Tobacco Comments   Vaping every other day.   Ready to quit: Not Answered Counseling given: Not Answered Tobacco comments: Vaping every other day.   Outpatient Medications  Prior to Visit  Medication Sig Dispense Refill   albuterol (VENTOLIN HFA) 108 (90 Base) MCG/ACT inhaler Inhale 1 puff into the lungs 2 (two) times daily as needed for wheezing.      ALPRAZolam (XANAX) 1 MG tablet Take 1 mg by mouth every morning.     amLODipine (NORVASC) 10 MG tablet Take 10 mg by mouth every morning.     aspirin EC 81 MG tablet Take 1 tablet (81 mg total) by mouth daily. 30 tablet 0   cetirizine (ZYRTEC) 10 MG chewable tablet Chew 1 tablet (10 mg total) by mouth daily. 30 tablet 0   Cholecalciferol (VITAMIN D) 50 MCG (2000 UT) tablet Take 2,000 Units by mouth every morning.     colchicine  0.6 MG tablet Take 0.6 mg by mouth daily.     fluticasone (FLONASE) 50 MCG/ACT nasal spray Place 1 spray into both nostrils at bedtime as needed for allergies.     hydrochlorothiazide (HYDRODIURIL) 25 MG tablet Take 25 mg by mouth every morning.     losartan (COZAAR) 100 MG tablet Take 100 mg by mouth at bedtime.     Multiple Vitamin (MULTIVITAMIN WITH MINERALS) TABS tablet Take 2 tablets by mouth every morning.     nitroGLYCERIN (NITROSTAT) 0.4 MG SL tablet Place 1 tablet (0.4 mg total) under the tongue every 5 (five) minutes x 3 doses as needed for chest pain. 25 tablet 3   omeprazole (PRILOSEC) 20 MG capsule Take 20 mg by mouth See admin instructions. Take one capsule (20 mg) by mouth every morning, take one capsule (20 mg) in the evening as needed for heartburn/acid reflux     potassium chloride SA (KLOR-CON M) 20 MEQ tablet Take 1 tablet (20 mEq total) by mouth daily. 30 tablet 0   tamsulosin (FLOMAX) 0.4 MG CAPS capsule Take 1 capsule (0.4 mg total) by mouth daily after breakfast. 30 capsule 0   valACYclovir (VALTREX) 500 MG tablet Take 500 mg by mouth every morning.     vitamin B-12 (CYANOCOBALAMIN) 100 MCG tablet Take 100 mcg by mouth every morning.     Eszopiclone 3 MG TABS Take 1 tablet (3 mg total) by mouth at bedtime. Take immediately before bedtime 30 tablet 0   QUEtiapine (SEROQUEL) 400 MG tablet TAKE 1 TABLET AT BEDTIME 90 tablet 1   furosemide (LASIX) 40 MG tablet Take 1 tablet (40 mg total) by mouth daily. (Patient not taking: Reported on 10/08/2022) 5 tablet 0   hydrOXYzine (ATARAX) 25 MG tablet Take 25-50 mg by mouth at bedtime. (Patient not taking: Reported on 10/08/2022)     metoprolol tartrate (LOPRESSOR) 25 MG tablet Take 1 tablet (25 mg total) by mouth 2 (two) times daily. 180 tablet 3   naproxen sodium (ALEVE) 220 MG tablet Take 440 mg by mouth 2 (two) times daily as needed (pain). (Patient not taking: Reported on 10/08/2022)     No facility-administered medications prior to  visit.     Review of Systems:   Constitutional: No weight loss or gain, night sweats, fevers, chills, or lassitude. +fatigue (over past month due to poor sleep) HEENT: No headaches, nasal congestion, or post nasal drip CV: +intermittent chest pains. No orthopnea, PND, swelling in lower extremities, anasarca, dizziness, palpitations, syncope Resp: No shortness of breath with exertion or at rest. No excess mucus or change in color of mucus. No productive or non-productive. No hemoptysis. No wheezing.  No chest wall deformity GI:  No heartburn, indigestion, abdominal pain, nausea, vomiting, diarrhea,  change in bowel habits, loss of appetite Skin: No rash, lesions, ulcerations MSK:  No joint pain or swelling.  +chronic back pain Neuro: No dizziness or lightheadedness.  Psych: No depression. Mood stable. +anxiety (Stable); sleep disturbance    Physical Exam:  BP 118/72 (BP Location: Right Arm, Patient Position: Sitting, Cuff Size: Normal)   Pulse 74   Temp 97.8 F (36.6 C) (Oral)   Ht 5' 9"$  (1.753 m)   Wt 196 lb 3.2 oz (89 kg)   SpO2 97%   BMI 28.97 kg/m   GEN: Pleasant, interactive, well-kempt; in no acute distress. HEENT:  Normocephalic and atraumatic. Sclera white. Nasal turbinates pink, moist and patent bilaterally. No rhinorrhea present. Oropharynx pink and moist, without exudate or edema. No lesions, ulcerations, or postnasal drip.  NECK:  Supple w/ fair ROM. No JVD present. Normal carotid impulses w/o bruits. Thyroid symmetrical with no goiter or nodules palpated. No lymphadenopathy.   CV: RRR, no m/r/g, no peripheral edema. Pulses intact, +2 bilaterally. No cyanosis, pallor or clubbing. PULMONARY:  Unlabored, regular breathing. Clear bilaterally A&P w/o wheezes/rales/rhonchi. No accessory muscle use.  GI: BS present and normoactive. Soft, non-tender to palpation. No organomegaly or masses detected.  MSK: No erythema, warmth or tenderness. Cap refil <2 sec all extrem. No  deformities or joint swelling noted.  Neuro: A/Ox3. No focal deficits noted.   Skin: Warm, no lesions or rashe Psych: Normal affect and behavior. Judgement and thought content appropriate.     Lab Results:  CBC    Component Value Date/Time   WBC 7.9 06/19/2022 2327   RBC 4.91 06/19/2022 2327   HGB 14.0 06/19/2022 2327   HCT 40.8 06/19/2022 2327   PLT 342 06/19/2022 2327   MCV 83.1 06/19/2022 2327   MCH 28.5 06/19/2022 2327   MCHC 34.3 06/19/2022 2327   RDW 13.1 06/19/2022 2327   LYMPHSABS 2.4 11/02/2021 0834   MONOABS 0.7 11/02/2021 0834   EOSABS 0.1 11/02/2021 0834   BASOSABS 0.1 11/02/2021 0834    BMET    Component Value Date/Time   NA 131 (L) 06/19/2022 2327   NA 140 03/28/2020 1613   K 3.3 (L) 06/19/2022 2327   CL 98 06/19/2022 2327   CO2 23 06/19/2022 2327   GLUCOSE 103 (H) 06/19/2022 2327   BUN 16 06/19/2022 2327   BUN 17 03/28/2020 1613   CREATININE 1.15 06/19/2022 2327   CALCIUM 9.0 06/19/2022 2327   GFRNONAA >60 06/19/2022 2327   GFRAA 68 03/28/2020 1613    BNP No results found for: "BNP"   Imaging:  No results found.        No data to display          No results found for: "NITRICOXIDE"      Assessment & Plan:   Insomnia Difficulties treating in the past. Seems to be doing well with Lunesta, seroquel and melatonin combination. We did discuss the importance of consistent follow up and that if he does not follow up regularly, we cannot refill his medications. He verbalized understanding. Rx refilled today. Medication education provided. 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 Sleep hygiene reviewed.   Patient Instructions  Continue melatonin 10 mg At bedtime  Continue Lunesta 1 tablet At bedtime. Ensure you go to bed after taking this Continue quetiapine (Seroquel) 400 mg At bedtime   Attend your appointment with your heart doctor Monday. If your chest discomfort returns/worsens, go to the emergency  department for further  evaluation.   Follow up with Dr. Elsworth Soho in 6 months, or sooner, if needed.   Circadian rhythm disorder Encouraged him to continue with regular bedtime schedule and exposure to light during the day. He will continue melatonin at night.   Other chest pain Intermittent chest pains over the past few months. Undergoing evaluation with cardiology. ED precautions advised.    I spent 30 minutes of dedicated to the care of this patient on the date of this encounter to include pre-visit review of records, face-to-face time with the patient discussing conditions above, post visit ordering of testing, clinical documentation with the electronic health record, making appropriate referrals as documented, and communicating necessary findings to members of the patients care team.  Clayton Bibles, NP 10/08/2022  Pt aware and understands NP's role.

## 2022-10-08 NOTE — Patient Instructions (Signed)
Continue melatonin 10 mg At bedtime  Continue Lunesta 1 tablet At bedtime. Ensure you go to bed after taking this Continue quetiapine (Seroquel) 400 mg At bedtime   Attend your appointment with your heart doctor Monday. If your chest discomfort returns/worsens, go to the emergency department for further evaluation.   Follow up with Dr. Elsworth Soho in 6 months, or sooner, if needed.

## 2022-10-11 ENCOUNTER — Encounter: Payer: Self-pay | Admitting: Cardiology

## 2022-10-11 ENCOUNTER — Ambulatory Visit: Payer: Medicare HMO | Admitting: Cardiology

## 2022-10-11 VITALS — BP 135/82 | HR 74 | Ht 69.0 in | Wt 196.0 lb

## 2022-10-11 DIAGNOSIS — Z87891 Personal history of nicotine dependence: Secondary | ICD-10-CM

## 2022-10-11 DIAGNOSIS — I10 Essential (primary) hypertension: Secondary | ICD-10-CM

## 2022-10-11 DIAGNOSIS — R072 Precordial pain: Secondary | ICD-10-CM

## 2022-10-11 DIAGNOSIS — I2510 Atherosclerotic heart disease of native coronary artery without angina pectoris: Secondary | ICD-10-CM

## 2022-10-11 DIAGNOSIS — E785 Hyperlipidemia, unspecified: Secondary | ICD-10-CM

## 2022-10-11 MED ORDER — ROSUVASTATIN CALCIUM 20 MG PO TABS: 20.0000 mg | ORAL_TABLET | Freq: Every day | ORAL | 0 refills | Status: AC

## 2022-10-11 NOTE — Progress Notes (Signed)
Date:  10/11/2022   ID:  Benjamin Morales, DOB [birth year], MRN [id number]  PCP:  Lin Landsman, MD  Cardiologist:  Rex Kras, DO, [facility] (established care 02/22/2020) Former Cardiology Providers: Dr. Dorris Carnes.   Date: 10/11/22 Last Office Visit: 05/15/2020  Chief Complaint  Patient presents with   Follow-up    Annual follow-up visit. Known CAD and CAC   HPI  Benjamin Morales is a 62 y.o. male whose past medical history and cardiovascular risk factors include: Coronary artery calcification, nonobstructive CAD, benign essential hypertension, dyslipidemia, former smoker, chronic excessive alcohol use, legally blind.  Patient was referred to the practice for evaluation of chest pain.  Due to his symptoms and multiple more visits he eventually  underwent coronary CTA which noted moderate CAC and nonobstructive CAD per CT FFR.   which was not significant.  Since last office visit he is doing well from a cardiovascular standpoint.  He at times has precordial discomfort which appears to be noncardiac based on symptoms.  He recently also was having swelling in the legs and therefore he was taken off of atorvastatin.  His swelling is no longer present.  He is attributing his swelling to going to work given his duties.  Denies orthopnea, PND.   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 No structured exercise program or daily routine.    ALLERGIES: Allergies  Allergen Reactions   Thorazine [Chlorpromazine] Anaphylaxis    MEDICATION LIST PRIOR TO VISIT: Current Meds  Medication Sig   albuterol (VENTOLIN HFA) 108 (90 Base) MCG/ACT inhaler Inhale 1 puff into the lungs 2 (two) times daily as needed for wheezing.    ALPRAZolam (XANAX) 1 MG tablet Take 1 mg by mouth every morning.   amLODipine (NORVASC) 10 MG tablet Take 10 mg by mouth every morning.   aspirin EC 81 MG tablet Take 1 tablet (81 mg total) by mouth daily.   Cholecalciferol  (VITAMIN D) 50 MCG (2000 UT) tablet Take 2,000 Units by mouth every morning.   colchicine 0.6 MG tablet Take 0.6 mg by mouth daily.   Eszopiclone 3 MG TABS Take 1 tablet (3 mg total) by mouth at bedtime. Take immediately before bedtime   fluticasone (FLONASE) 50 MCG/ACT nasal spray Place 1 spray into both nostrils at bedtime as needed for allergies.   hydrochlorothiazide (HYDRODIURIL) 25 MG tablet Take 25 mg by mouth every morning.   losartan (COZAAR) 100 MG tablet Take 100 mg by mouth at bedtime.   nitroGLYCERIN (NITROSTAT) 0.4 MG SL tablet Place 1 tablet (0.4 mg total) under the tongue every 5 (five) minutes x 3 doses as needed for chest pain.   QUEtiapine (SEROQUEL) 400 MG tablet Take 1 tablet (400 mg total) by mouth at bedtime.   rosuvastatin (CRESTOR) 20 MG tablet Take 1 tablet (20 mg total) by mouth at bedtime.   valACYclovir (VALTREX) 500 MG tablet Take 500 mg by mouth every morning.   vitamin B-12 (CYANOCOBALAMIN) 100 MCG tablet Take 100 mcg by mouth every morning.     PAST MEDICAL HISTORY: Past Medical History:  Diagnosis Date   HLD (hyperlipidemia)    Hypertension    Legally blind    Low back pain     PAST SURGICAL HISTORY: Past Surgical History:  Procedure Laterality Date   EYE SURGERY      FAMILY HISTORY: The patient family history includes Emphysema in his paternal uncle; Heart disease in his father; Stroke in his  father.  SOCIAL HISTORY:  The patient  reports that he quit smoking about 3 years ago. His smoking use included cigarettes. He has never used smokeless tobacco. He reports current alcohol use of about 8.0 - 9.0 standard drinks of alcohol per week. He reports that he does not use drugs.  REVIEW OF SYSTEMS: Review of Systems  Constitutional: Negative for chills and fever.  HENT:  Negative for hoarse voice and nosebleeds.        Legally blind.   Eyes:  Negative for discharge, double vision and pain.  Cardiovascular:  Positive for chest pain (Present,  infrequent, see HPI) and leg swelling. Negative for claudication, dyspnea on exertion, near-syncope, orthopnea, palpitations, paroxysmal nocturnal dyspnea and syncope.  Respiratory:  Negative for hemoptysis and shortness of breath.   Musculoskeletal:  Negative for muscle cramps and myalgias.  Gastrointestinal:  Negative for abdominal pain, constipation, diarrhea, hematemesis, hematochezia, melena, nausea and vomiting.  Neurological:  Negative for dizziness and light-headedness.    PHYSICAL EXAM:    10/11/2022    1:02 PM 10/08/2022   11:30 AM 06/20/2022    3:01 AM  Vitals with BMI  Height 5' 9"$  5' 9"$    Weight 196 lbs 196 lbs 3 oz   BMI [id number] [id number]   Systolic [id number]  Diastolic 82 72 97  Pulse 74 74 57   CONSTITUTIONAL: Well-developed and well-nourished. No acute distress.  SKIN: Skin is warm and dry. No rash noted. No cyanosis. No pallor. No jaundice HEAD: Normocephalic and atraumatic.  EYES: No scleral icterus MOUTH/THROAT: Moist oral membranes.  NECK: No JVD present. No thyromegaly noted. No carotid bruits  CHEST Normal respiratory effort. No intercostal retractions  LUNGS: Clear to auscultation bilaterally.  No stridor. No wheezes. No rales.  CARDIOVASCULAR: Regular rate and rhythm, positive S1-S2, no murmurs rubs or gallops appreciated. ABDOMINAL: No apparent ascites.  EXTREMITIES: No peripheral edema  HEMATOLOGIC: No significant bruising NEUROLOGIC: Oriented to person, place, and time. Nonfocal. Normal muscle tone.  PSYCHIATRIC: Normal mood and affect. Normal behavior. Cooperative  CARDIAC DATABASE: EKG: 10/11/2022: Sinus rhythm, 69 bpm, nonspecific T wave abnormality.  Echocardiogram: 03/06/2020:  Normal LV systolic function with visual EF 55-60%. Left ventricle cavity is normal in size. Moderate left ventricular hypertrophy. Normal global wall motion. Normal diastolic filling pattern, normal LAP.  Mild tricuspid regurgitation. No evidence of pulmonary hypertension.   No other significant valvular abnormalities.  IVC is dilated with a respiratory response of >50%.  No significant change compared to prior study on 08/22/2015.  Stress Testing: None  Heart Catheterization: None  Coronary CTA 03/26/2020: 1. Coronary calcium score is 139, which places the patient in the 88th percentile for age and sex matched control. 2. Mid ascending aorta measures 35 mm. 3. Pulmonary artery measures 31 mm. 4. CAD-RADS = 3. Nonobstructive CAD in the left main, LAD, and LCX. Moderate stenosis in the RCA. 5. Study sent for CT-FFR.  6. CT FFR analysis showed no significant stenosis. 7. Subpleural right lower lobe nodule measures 4 mm.   LABORATORY DATA:    Latest Ref Rng & Units 06/19/2022   11:27 PM 11/02/2021    8:34 AM 11/02/2020    9:48 AM  CBC  WBC 4.0 - 10.5 K/uL 7.9  9.9  13.6   Hemoglobin 13.0 - 17.0 g/dL 14.0  13.1  13.3   Hematocrit 39.0 - 52.0 % 40.8  38.9  39.8   Platelets 150 - 400 K/uL 342  285  366  Latest Ref Rng & Units 06/19/2022   11:27 PM 11/02/2021    8:34 AM 11/02/2020    9:48 AM  CMP  Glucose 70 - 99 mg/dL 103  101  90   BUN 8 - 23 mg/dL 16  14  8   $ Creatinine 0.61 - 1.24 mg/dL 1.15  1.35  1.06   Sodium 135 - 145 mmol/L 131  136  139   Potassium 3.5 - 5.1 mmol/L 3.3  3.9  3.2   Chloride 98 - 111 mmol/L 98  103  97   CO2 22 - 32 mmol/L 23  23  28   $ Calcium 8.9 - 10.3 mg/dL 9.0  8.4  9.4   Total Protein 6.5 - 8.1 g/dL 8.3  6.3  8.4   Total Bilirubin 0.3 - 1.2 mg/dL 0.9  1.1  0.8   Alkaline Phos 38 - 126 U/L 51  48  62   AST 15 - 41 U/L 29  60  25   ALT 0 - 44 U/L 33  37  29     Lipid Panel     Component Value Date/Time   CHOL 172 02/08/2020 0450   TRIG 355 (H) 02/08/2020 0450   HDL 34 (L) 02/08/2020 0450   CHOLHDL 5.1 02/08/2020 0450   VLDL 71 (H) 02/08/2020 0450   LDLCALC 67 02/08/2020 0450    No components found for: "NTPROBNP" No results for input(s): "PROBNP" in the last 8760 hours. No results for input(s): "TSH"  in the last 8760 hours.   BMP Recent Labs    11/02/21 0834 06/19/22 2327  NA 136 131*  K 3.9 3.3*  CL 103 98  CO2 23 23  GLUCOSE 101* 103*  BUN 14 16  CREATININE 1.35* 1.15  CALCIUM 8.4* 9.0  GFRNONAA 59* >60    HEMOGLOBIN A1C Lab Results  Component Value Date   HGBA1C 6.5 (H) 02/07/2020   MPG 139.85 02/07/2020    IMPRESSION:    ICD-10-CM   1. Precordial pain  R07.2 PCV ECHOCARDIOGRAM COMPLETE    2. Coronary artery calcification  I25.10 EKG 12-Lead   I25.84 Lipid Panel With LDL/HDL Ratio    LDL cholesterol, direct    CMP14+EGFR    rosuvastatin (CRESTOR) 20 MG tablet    3. Nonobstructive atherosclerosis of coronary artery  I25.10     4. Benign hypertension  I10     5. Dyslipidemia  E78.5 Lipid Panel With LDL/HDL Ratio    LDL cholesterol, direct    CMP14+EGFR    rosuvastatin (CRESTOR) 20 MG tablet    6. Former smoker  Z87.891        RECOMMENDATIONS: Willia Seman is a 62 y.o. male whose past medical history and cardiac risk factors include: Benign essential hypertension, dyslipidemia, former smoker, chronic excessive alcohol use, legally blind.  Precordial pain Coronary artery calcification Nonobstructive atherosclerosis of coronary artery Last office visit 2 years ago at least. Precordial pain appears to be noncardiac based on symptomatology. EKG nonischemic. Prior coronary CTA noted a total CAC of 139 AU placing him at the 88th percentile.  Noted to have nonobstructive CAD with CT FFR negative for hemodynamically significant stenosis. Continue aspirin 81 mg p.o. daily. Had discontinued atorvastatin due to concerns that it was causing him to swell up. Will start him on Crestor with follow-up labs in 6 weeks to reevaluate lipids and triglycerides. Educated him on importance of improving his modifiable cardiovascular risk factors for secondary prevention. Echo will be ordered to evaluate for  structural heart disease and left ventricular systolic  function. If the symptoms continue to worsen patient is asked to go to the ER for further evaluation and management and if there is a significant change in the echo or concerns for regional wall motion abnormalities further workup would be warranted sooner. Will keep a close follow-up. If additional workup is warranted would recommend either pharmacological stress or coronary CTA.  Patient is legally blind and unable to exercise.  Benign hypertension Office blood pressures within acceptable limits. Medications reconciled. Currently managed by primary care provider. May consider transitioning from amlodipine to another antihypertensive medications given his lower extremity swelling.  On clinical examination he has no swelling and therefore will defer further management to PCP at this time.  Dyslipidemia No recent labs available for review. Has stopped atorvastatin for several months due to the concern that it was causing him to have lower extremity swelling. As discussed above we will start rosuvastatin with labs in 6 weeks to evaluate lipids.  Former smoker Educated on importance of complete smoking cessation.    FINAL MEDICATION LIST END OF ENCOUNTER: Meds ordered this encounter  Medications   rosuvastatin (CRESTOR) 20 MG tablet    Sig: Take 1 tablet (20 mg total) by mouth at bedtime.    Dispense:  90 tablet    Refill:  0    Medications Discontinued During This Encounter  Medication Reason   cetirizine (ZYRTEC) 10 MG chewable tablet Patient Preference   furosemide (LASIX) 40 MG tablet Patient Preference   hydrOXYzine (ATARAX) 25 MG tablet Patient Preference   metoprolol tartrate (LOPRESSOR) 25 MG tablet Patient Preference   Multiple Vitamin (MULTIVITAMIN WITH MINERALS) TABS tablet Patient Preference   naproxen sodium (ALEVE) 220 MG tablet Patient Preference   omeprazole (PRILOSEC) 20 MG capsule Patient Preference   potassium chloride SA (KLOR-CON M) 20 MEQ tablet Patient  Preference   tamsulosin (FLOMAX) 0.4 MG CAPS capsule Patient Preference     Current Outpatient Medications:    albuterol (VENTOLIN HFA) 108 (90 Base) MCG/ACT inhaler, Inhale 1 puff into the lungs 2 (two) times daily as needed for wheezing. , Disp: , Rfl:    ALPRAZolam (XANAX) 1 MG tablet, Take 1 mg by mouth every morning., Disp: , Rfl:    amLODipine (NORVASC) 10 MG tablet, Take 10 mg by mouth every morning., Disp: , Rfl:    aspirin EC 81 MG tablet, Take 1 tablet (81 mg total) by mouth daily., Disp: 30 tablet, Rfl: 0   Cholecalciferol (VITAMIN D) 50 MCG (2000 UT) tablet, Take 2,000 Units by mouth every morning., Disp: , Rfl:    colchicine 0.6 MG tablet, Take 0.6 mg by mouth daily., Disp: , Rfl:    Eszopiclone 3 MG TABS, Take 1 tablet (3 mg total) by mouth at bedtime. Take immediately before bedtime, Disp: 30 tablet, Rfl: 0   fluticasone (FLONASE) 50 MCG/ACT nasal spray, Place 1 spray into both nostrils at bedtime as needed for allergies., Disp: , Rfl:    hydrochlorothiazide (HYDRODIURIL) 25 MG tablet, Take 25 mg by mouth every morning., Disp: , Rfl:    losartan (COZAAR) 100 MG tablet, Take 100 mg by mouth at bedtime., Disp: , Rfl:    nitroGLYCERIN (NITROSTAT) 0.4 MG SL tablet, Place 1 tablet (0.4 mg total) under the tongue every 5 (five) minutes x 3 doses as needed for chest pain., Disp: 25 tablet, Rfl: 3   QUEtiapine (SEROQUEL) 400 MG tablet, Take 1 tablet (400 mg total) by mouth at bedtime., Disp:  90 tablet, Rfl: 1   rosuvastatin (CRESTOR) 20 MG tablet, Take 1 tablet (20 mg total) by mouth at bedtime., Disp: 90 tablet, Rfl: 0   valACYclovir (VALTREX) 500 MG tablet, Take 500 mg by mouth every morning., Disp: , Rfl:    vitamin B-12 (CYANOCOBALAMIN) 100 MCG tablet, Take 100 mcg by mouth every morning., Disp: , Rfl:   Orders Placed This Encounter  Procedures   Lipid Panel With LDL/HDL Ratio   LDL cholesterol, direct   CMP14+EGFR   EKG 12-Lead   PCV ECHOCARDIOGRAM COMPLETE     There are no  Patient Instructions on file for this visit.   --Continue cardiac medications as reconciled in final medication list. --Return in about 7 weeks (around 11/29/2022) for Reevaluation of, Chest pain, Coronary artery calcification. Or sooner if needed. --Continue follow-up with your primary care physician regarding the management of your other chronic comorbid conditions.  Patient's questions and concerns were addressed to his satisfaction. He voices understanding of the instructions provided during this encounter.   This note was created using a voice recognition software as a result there may be grammatical errors inadvertently enclosed that do not reflect the nature of this encounter. Every attempt is made to correct such errors.  Rex Kras, [place], [facility]  Pager: [phone] Office: [phone]

## 2022-10-18 ENCOUNTER — Other Ambulatory Visit: Payer: Medicare HMO

## 2022-10-20 ENCOUNTER — Other Ambulatory Visit: Payer: Medicare HMO

## 2022-11-15 ENCOUNTER — Other Ambulatory Visit: Payer: Medicare HMO

## 2022-11-16 ENCOUNTER — Other Ambulatory Visit: Payer: Self-pay | Admitting: Nurse Practitioner

## 2022-11-16 DIAGNOSIS — F5104 Psychophysiologic insomnia: Secondary | ICD-10-CM

## 2022-11-16 DIAGNOSIS — G4724 Circadian rhythm sleep disorder, free running type: Secondary | ICD-10-CM

## 2022-11-23 ENCOUNTER — Other Ambulatory Visit: Payer: Medicare HMO

## 2022-11-29 ENCOUNTER — Ambulatory Visit: Payer: Self-pay | Admitting: Cardiology

## 2022-12-09 ENCOUNTER — Other Ambulatory Visit: Payer: Self-pay | Admitting: Nurse Practitioner

## 2022-12-09 DIAGNOSIS — G4724 Circadian rhythm sleep disorder, free running type: Secondary | ICD-10-CM

## 2022-12-09 DIAGNOSIS — F5104 Psychophysiologic insomnia: Secondary | ICD-10-CM

## 2022-12-09 NOTE — Telephone Encounter (Signed)
Please advise on refill request

## 2022-12-13 NOTE — Telephone Encounter (Signed)
30-day supply with 2 refills sent

## 2023-02-08 ENCOUNTER — Other Ambulatory Visit: Payer: Medicare HMO

## 2023-02-15 ENCOUNTER — Ambulatory Visit: Payer: Medicare HMO | Admitting: Cardiology

## 2023-06-28 ENCOUNTER — Ambulatory Visit (HOSPITAL_BASED_OUTPATIENT_CLINIC_OR_DEPARTMENT_OTHER): Payer: Medicare HMO | Admitting: Pulmonary Disease

## 2023-06-30 ENCOUNTER — Encounter (HOSPITAL_BASED_OUTPATIENT_CLINIC_OR_DEPARTMENT_OTHER): Payer: Self-pay | Admitting: Pulmonary Disease

## 2023-08-01 ENCOUNTER — Telehealth: Payer: Self-pay | Admitting: Nurse Practitioner

## 2023-08-01 DIAGNOSIS — G4724 Circadian rhythm sleep disorder, free running type: Secondary | ICD-10-CM

## 2023-08-01 DIAGNOSIS — F5104 Psychophysiologic insomnia: Secondary | ICD-10-CM

## 2023-08-01 NOTE — Telephone Encounter (Signed)
Patient is aware that you are out of the office until tomorrow.

## 2023-08-02 MED ORDER — ESZOPICLONE 3 MG PO TABS: 3.0000 mg | ORAL_TABLET | Freq: Every day | ORAL | 0 refills | Status: AC

## 2023-08-02 NOTE — Telephone Encounter (Signed)
I refilled 30 tablets. Last fill was in September. PDMP reviewed. He has to keep his next appt to receive further refills. Does Dr. Vassie Loll have anything sooner at [facility]? Thanks

## 2023-08-02 NOTE — Telephone Encounter (Signed)
I have notified the patient. The soonest appt is 1/23 with Katie.   Per Florentina Addison, that appt is fine.  Nothing further needed.

## 2023-09-22 ENCOUNTER — Ambulatory Visit: Payer: Medicare HMO | Admitting: Nurse Practitioner

## 2024-04-05 ENCOUNTER — Ambulatory Visit: Admitting: Pulmonary Disease

## 2024-06-13 ENCOUNTER — Ambulatory Visit: Admitting: Pulmonary Disease

## 2024-06-13 ENCOUNTER — Encounter: Payer: Self-pay | Admitting: Pulmonary Disease

## 2024-06-13 DIAGNOSIS — G4724 Circadian rhythm sleep disorder, free running type: Secondary | ICD-10-CM

## 2024-06-18 ENCOUNTER — Ambulatory Visit (INDEPENDENT_AMBULATORY_CARE_PROVIDER_SITE_OTHER): Payer: Self-pay | Admitting: Podiatry

## 2024-06-18 DIAGNOSIS — Z91199 Patient's noncompliance with other medical treatment and regimen due to unspecified reason: Secondary | ICD-10-CM

## 2024-06-20 NOTE — Progress Notes (Signed)
 Patient did not come in for appointment and did not cancel
